# Patient Record
Sex: Male | Born: 1985 | Race: White | Hispanic: No | Marital: Single | Smoking: Current every day smoker
Health system: Southern US, Community
[De-identification: ages and names within clinical notes are randomized; demographics above are authoritative.]

## PROBLEM LIST (undated history)

## (undated) DIAGNOSIS — M549 Dorsalgia, unspecified: Secondary | ICD-10-CM

## (undated) HISTORY — PX: TONSILLECTOMY: SUR1361

---

## 2004-01-30 ENCOUNTER — Emergency Department (HOSPITAL_COMMUNITY): Admission: EM | Admit: 2004-01-30 | Discharge: 2004-01-30 | Payer: Self-pay | Admitting: Family Medicine

## 2005-04-01 ENCOUNTER — Emergency Department (HOSPITAL_COMMUNITY): Admission: EM | Admit: 2005-04-01 | Discharge: 2005-04-01 | Payer: Self-pay | Admitting: Family Medicine

## 2005-04-24 ENCOUNTER — Emergency Department (HOSPITAL_COMMUNITY): Admission: EM | Admit: 2005-04-24 | Discharge: 2005-04-24 | Payer: Self-pay | Admitting: Emergency Medicine

## 2005-04-30 ENCOUNTER — Emergency Department (HOSPITAL_COMMUNITY): Admission: EM | Admit: 2005-04-30 | Discharge: 2005-04-30 | Payer: Self-pay | Admitting: Family Medicine

## 2005-05-05 ENCOUNTER — Ambulatory Visit: Payer: Self-pay | Admitting: Internal Medicine

## 2005-05-08 ENCOUNTER — Ambulatory Visit (HOSPITAL_COMMUNITY): Admission: RE | Admit: 2005-05-08 | Discharge: 2005-05-08 | Payer: Self-pay | Admitting: Internal Medicine

## 2005-05-10 ENCOUNTER — Encounter: Admission: RE | Admit: 2005-05-10 | Discharge: 2005-08-08 | Payer: Self-pay | Admitting: Internal Medicine

## 2006-05-13 ENCOUNTER — Emergency Department (HOSPITAL_COMMUNITY): Admission: EM | Admit: 2006-05-13 | Discharge: 2006-05-13 | Payer: Self-pay | Admitting: Emergency Medicine

## 2007-09-20 IMAGING — CT CT ANGIO CHEST
4 of 5 series · 18 of 31 positions shown · IV contrast (100 ML OMNI 300)
Comparison: none

CLINICAL DATA: Severe chest and abdominal pain.  Shortness of breath.    Nausea.  Evaluate for pulmonary embolism and aortic aneurysm or dissection.
 CT ANGIOGRAPHY OF CHEST:
TECHNIQUE: Multidetector CT imaging of the chest was performed during bolus injection of intravenous contrast.  Multiplanar CT angiographic image reconstructions were generated to evaluate the vascular anatomy.
 Contrast:  100 cc Omnipaque 300.
TECHNIQUE: Multidetector CT imaging of the abdomen was performed during bolus injection of intravenous contrast.  Multiplanar CT angiographic image reconstructions were generated to evaluate the vascular anatomy.

[Series 2: pe · axial · 0.73mm/px · z∈[-530,-158]mm · 9 of 383 slices shown]
[im 43/383  lung]
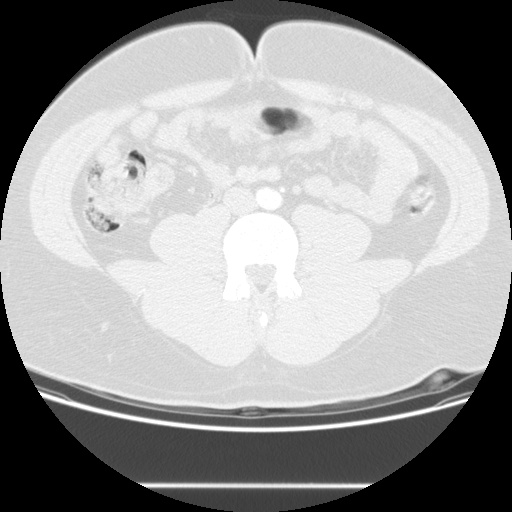
[im 85/383  mediastinal]
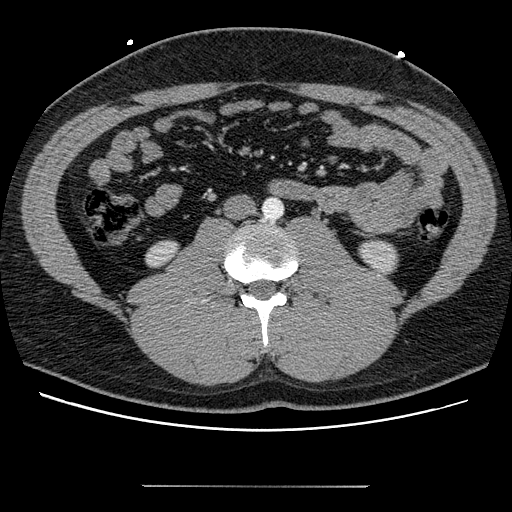
[im 128/383  lung]
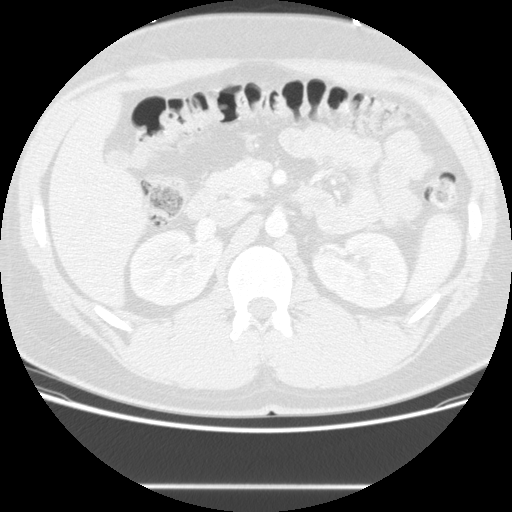
[im 170/383  mediastinal]
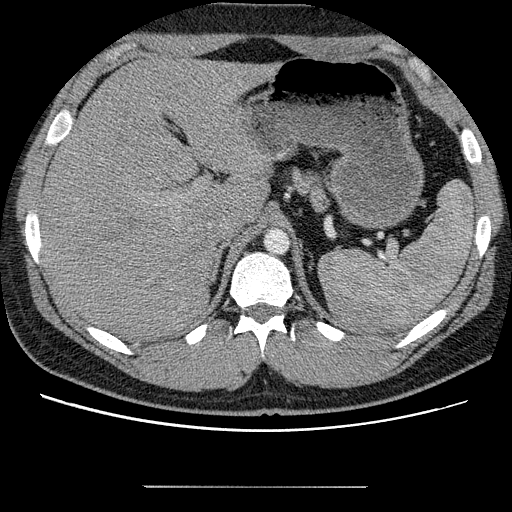
[im 188/383  lung]
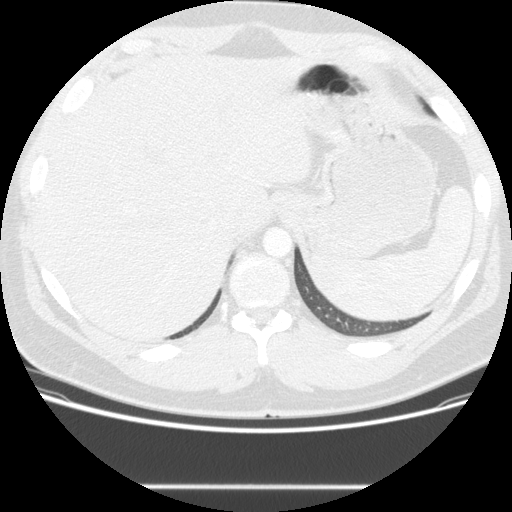
[im 213/383  mediastinal]
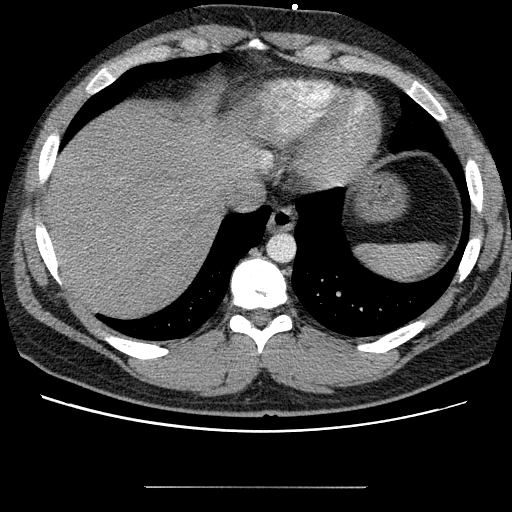
[im 255/383  lung]
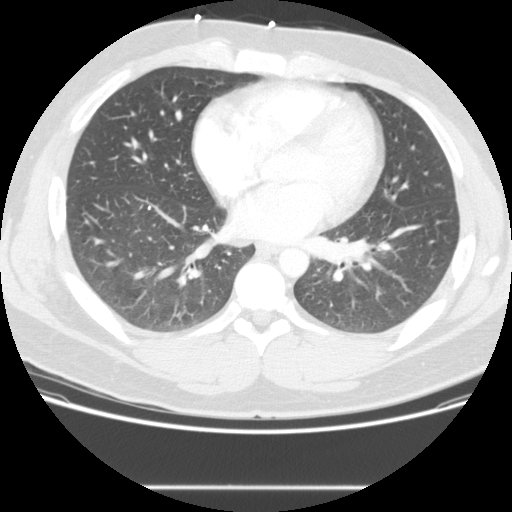
[im 298/383  mediastinal]
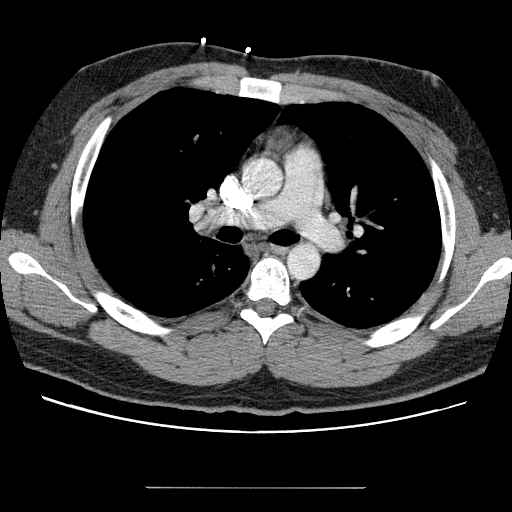
[im 340/383  lung]
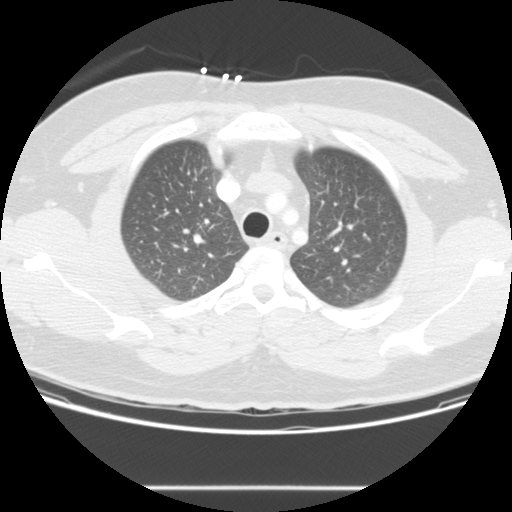

[Series 3: recon 2: pe · axial · 0.73mm/px · z∈[-464,-224]mm · 4 of 192 slices shown]
[im 48/192  lung]
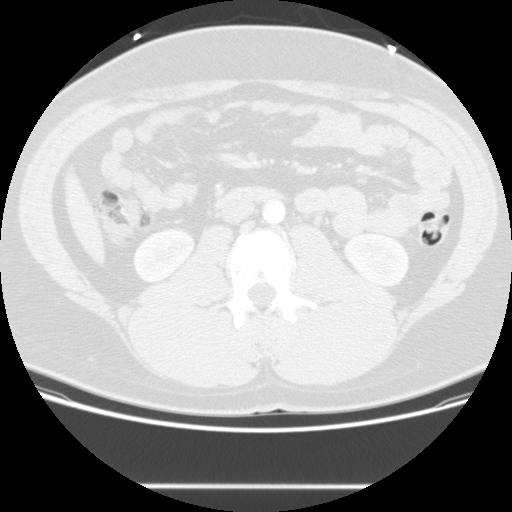
[im 95/192  lung]
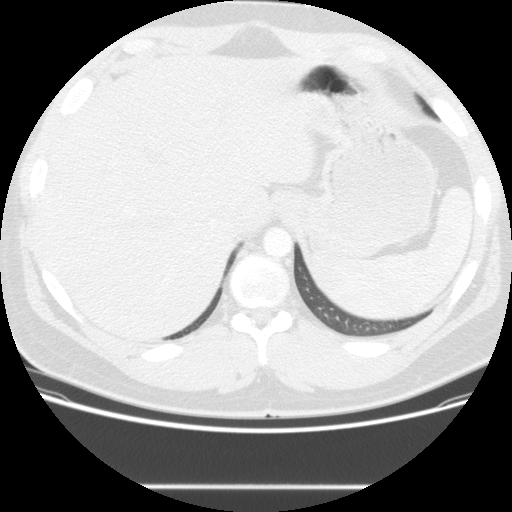
[im 96/192  lung]
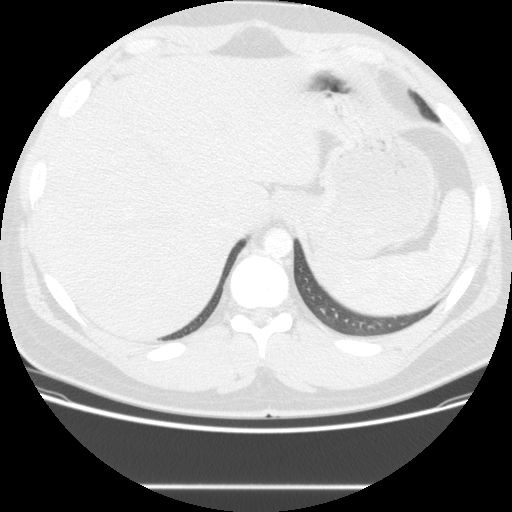
[im 144/192  lung]
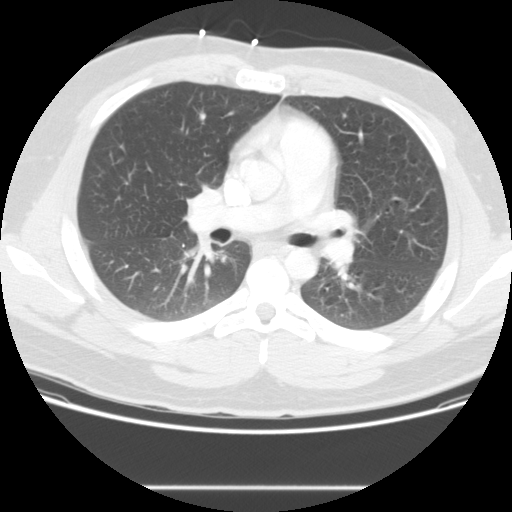

[Series 201: reformatted · sagittal · 0.96mm/px · 3 of 182 slices shown (1 of 2)]
[im 46/182  lung]
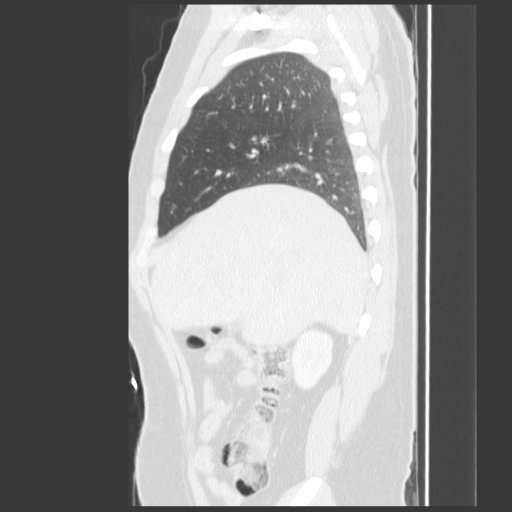
[im 91/182  lung]
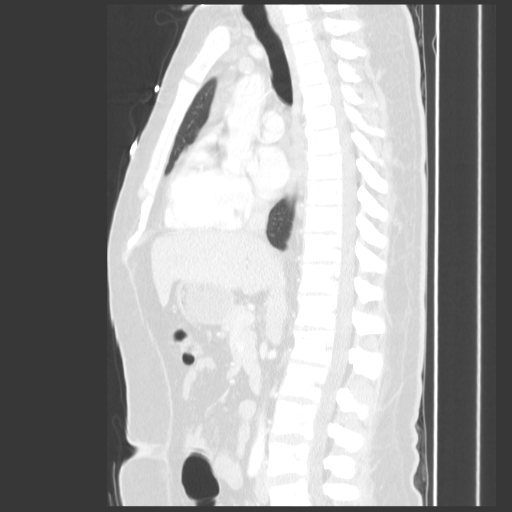
[im 136/182  lung]
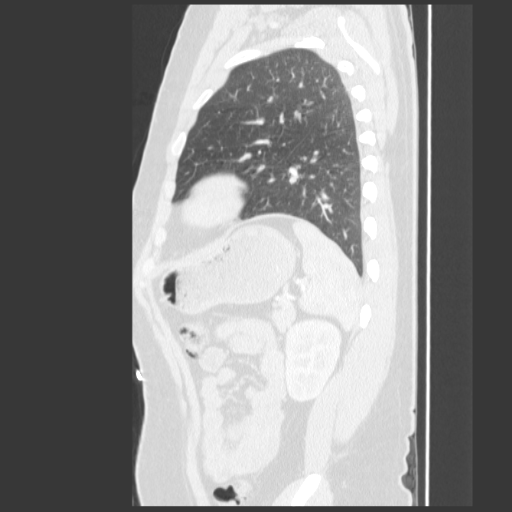

[Series 202: reformatted · coronal · 0.96mm/px · 2 of 142 slices shown (2 of 2)]
[im 48/142  lung]
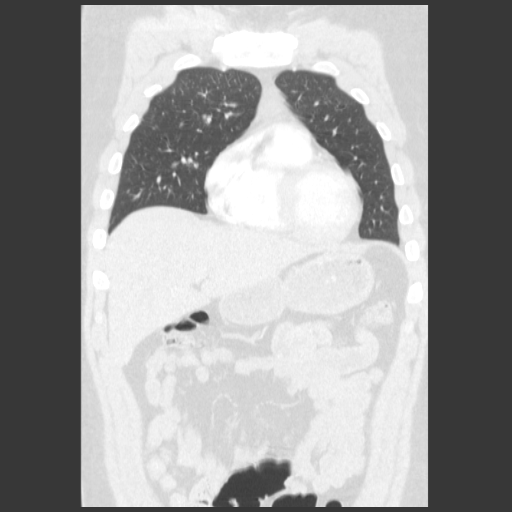
[im 95/142  lung]
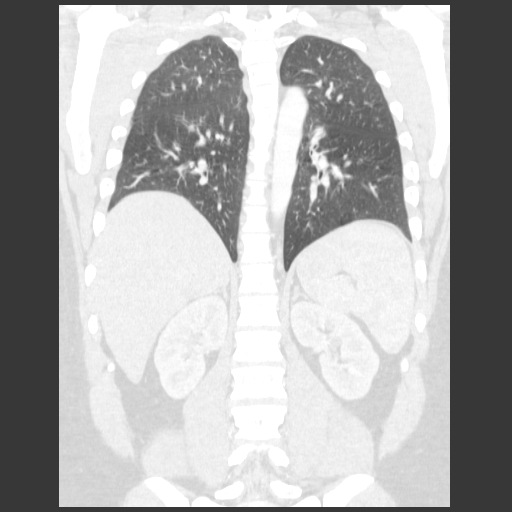

[18 of 31 positions shown; findings below may reference images not displayed]

FINDINGS: Contrast opacification of the pulmonary arteries is somewhat suboptimal, but no acute pulmonary embolism is identified.  There is no evidence of thoracic aortic aneurysm or dissection.  Mild residual thymic tissue is seen in the anterior mediastinum, which is normal for age.  There is no evidence of hilar or mediastinal masses.  No adenopathy is seen elsewhere within the thorax.
 There is no evidence of pleural or pericardial effusion.  There is no evidence of pulmonary infiltrate or mass.
IMPRESSION: Negative.  No evidence of acute pulmonary embolism or other active disease.  No evidence of aortic aneurysm or dissection. 
 CT ANGIOGRAPHY OF ABDOMEN:
FINDINGS: There is no evidence of abdominal aortic aneurysm or dissection.  There is no evidence of retroperitoneal hemorrhage.  The abdominal parenchymal organs are normal in appearance.  The gallbladder is unremarkable.  There is no evidence of mass or inflammatory process.  There is no evidence of dilated bowel loops.
IMPRESSION: Negative.  No evidence of aortic aneurysm or dissection or other acute findings.

## 2008-03-03 ENCOUNTER — Encounter: Payer: Self-pay | Admitting: Family Medicine

## 2012-01-12 ENCOUNTER — Encounter (HOSPITAL_BASED_OUTPATIENT_CLINIC_OR_DEPARTMENT_OTHER): Payer: Self-pay | Admitting: Family Medicine

## 2012-01-12 ENCOUNTER — Emergency Department (HOSPITAL_BASED_OUTPATIENT_CLINIC_OR_DEPARTMENT_OTHER)
Admission: EM | Admit: 2012-01-12 | Discharge: 2012-01-12 | Disposition: A | Discharge status: Home / Self Care | Payer: Self-pay | Attending: Emergency Medicine | Admitting: Emergency Medicine

## 2012-01-12 ENCOUNTER — Emergency Department (INDEPENDENT_AMBULATORY_CARE_PROVIDER_SITE_OTHER): Payer: Self-pay

## 2012-01-12 DIAGNOSIS — N451 Epididymitis: Secondary | ICD-10-CM

## 2012-01-12 DIAGNOSIS — R109 Unspecified abdominal pain: Secondary | ICD-10-CM

## 2012-01-12 DIAGNOSIS — R300 Dysuria: Secondary | ICD-10-CM | POA: Insufficient documentation

## 2012-01-12 DIAGNOSIS — K089 Disorder of teeth and supporting structures, unspecified: Secondary | ICD-10-CM | POA: Insufficient documentation

## 2012-01-12 DIAGNOSIS — N509 Disorder of male genital organs, unspecified: Secondary | ICD-10-CM | POA: Insufficient documentation

## 2012-01-12 DIAGNOSIS — N432 Other hydrocele: Secondary | ICD-10-CM

## 2012-01-12 DIAGNOSIS — I861 Scrotal varices: Secondary | ICD-10-CM

## 2012-01-12 DIAGNOSIS — F17200 Nicotine dependence, unspecified, uncomplicated: Secondary | ICD-10-CM | POA: Insufficient documentation

## 2012-01-12 LAB — URINE MICROSCOPIC-ADD ON

## 2012-01-12 LAB — URINALYSIS, ROUTINE W REFLEX MICROSCOPIC
Specific Gravity, Urine: 1.029 (ref 1.005–1.030)
Urobilinogen, UA: 1 mg/dL (ref 0.0–1.0)
pH: 5.5 (ref 5.0–8.0)

## 2012-01-12 MED ORDER — CIPROFLOXACIN HCL 500 MG PO TABS: 500.0000 mg | ORAL_TABLET | Freq: Two times a day (BID) | ORAL | Status: DC

## 2012-01-12 MED ORDER — CIPROFLOXACIN HCL 500 MG PO TABS: 500.0000 mg | ORAL_TABLET | Freq: Two times a day (BID) | ORAL | Status: AC

## 2012-01-12 MED ORDER — OXYCODONE-ACETAMINOPHEN 5-325 MG PO TABS
2.0000 | ORAL_TABLET | Freq: Once | ORAL | Status: AC
  Administered 2012-01-12: 2 via ORAL
  Filled 2012-01-12: qty 2

## 2012-01-12 MED ORDER — OXYCODONE-ACETAMINOPHEN 5-325 MG PO TABS: 2.0000 | ORAL_TABLET | ORAL | Status: AC | PRN

## 2012-01-12 MED ORDER — AZITHROMYCIN 250 MG PO TABS
2000.0000 mg | ORAL_TABLET | Freq: Once | ORAL | Status: AC
  Administered 2012-01-12: 2000 mg via ORAL
  Filled 2012-01-12: qty 8

## 2012-01-12 NOTE — ED Notes (Signed)
MD at bedside. 

## 2012-01-12 NOTE — ED Provider Notes (Signed)
History     CSN: [id number]  Arrival date & time 01/12/12  1336   First MD Initiated Contact with Patient 01/12/12 1410      Chief Complaint  Patient presents with  . Abdominal Pain  . Dysuria  . Dental Pain    (Consider location/radiation/quality/duration/timing/severity/associated sxs/prior treatment) Patient is a 26 y.o. male presenting with abdominal pain, dysuria, and tooth pain. The history is provided by the patient.  Abdominal Pain The primary symptoms of the illness include abdominal pain and dysuria. The current episode started more than 2 days ago. The onset of the illness was gradual. The problem has been gradually worsening.  Significant associated medical issues do not include inflammatory bowel disease or diabetes.  Dysuria   Dental PainThe primary symptoms include mouth pain. The symptoms began more than 1 week ago. The symptoms are worsening. The symptoms are new. The symptoms occur constantly.  Additional symptoms include: gum swelling.  Pt complains of a broken tooth and request a referral to a dentist  History reviewed. No pertinent past medical history.  History reviewed. No pertinent past surgical history.  History reviewed. No pertinent family history.  History  Substance Use Topics  . Smoking status: Current Everyday Smoker  . Smokeless tobacco: Not on file  . Alcohol Use: No      Review of Systems  HENT: Positive for dental problem.   Gastrointestinal: Positive for abdominal pain.  Genitourinary: Positive for dysuria.  All other systems reviewed and are negative.    Allergies  Ceclor; Toradol; and Ultram  Home Medications   Current Outpatient Rx  Name Route Sig Dispense Refill  . IBUPROFEN 200 MG PO TABS Oral Take 600 mg by mouth every 6 (six) hours as needed. Patient used this medication for pain.      BP 142/73  Pulse 67  Temp(Src) 98.8 F (37.1 C) (Oral)  Resp 16  Ht 6\' 2"  (1.88 m)  Wt 261 lb (118.389 kg)  BMI 33.51 kg/m2   SpO2 100%  Physical Exam  Nursing note and vitals reviewed. Constitutional: He appears well-developed and well-nourished.  HENT:  Head: Normocephalic and atraumatic.  Right Ear: External ear normal.  Left Ear: External ear normal.  Nose: Nose normal.       Broken lower right 1st molar  Eyes: Conjunctivae are normal. Pupils are equal, round, and reactive to light.  Neck: Normal range of motion. Neck supple.  Cardiovascular: Normal rate.   Pulmonary/Chest: Effort normal.  Abdominal: Soft.  Genitourinary: Penis normal.       Tender bilat scrotum, testicle tender,  No swelling  Musculoskeletal: Normal range of motion.  Neurological: He is alert.  Skin: Skin is warm.    ED Course  Procedures (including critical care time)  Labs Reviewed  URINALYSIS, ROUTINE W REFLEX MICROSCOPIC - Abnormal; Notable for the following:    Color, Urine AMBER (*) BIOCHEMICALS MAY BE AFFECTED BY COLOR   Bilirubin Urine SMALL (*)    Ketones, ur 15 (*)    Leukocytes, UA TRACE (*)    All other components within normal limits  URINE MICROSCOPIC-ADD ON - Abnormal; Notable for the following:    Bacteria, UA MANY (*)    All other components within normal limits   US Scrotum  01/12/2012  *RADIOLOGY REPORT*  Clinical Data: Pain  ULTRASOUND OF SCROTUM  Technique:  Complete ultrasound examination of the testicles, epididymis, and other scrotal structures was performed.  Comparison:  None.  Findings:  Both testicles are normal in  size and echotexture.  The right testicle measures 4.1 x 2.2 x 3.1 cm, and the left testicle measures 4.6 x 2.3 x 3.4 cm.  Both epididymi have a normal appearance.  Vascular flow appears symmetric within both testicles. Small, simple bilateral hydroceles are present.  There is a small varicocele on the left.  IMPRESSION: Small, simple bilateral hydroceles.  Small left varicocele.  Original Report Authenticated By: Brandon Melnick, M.D.     No diagnosis found.    MDM  Pt given rx for  cipro 500 bid.  Referral to urology        Elson Areas, PA 01/12/12 [phone]

## 2012-01-12 NOTE — ED Notes (Signed)
Patient transported to Ultrasound 

## 2012-01-12 NOTE — ED Notes (Signed)
Lower abdominal pain dysuria for several days also reports pain in testicles as well as tooth pain;

## 2012-01-12 NOTE — Discharge Instructions (Signed)
Epididymitis  Epididymitis is a swelling (inflammation) of the epididymis. The epididymis is a cord-like structure along the back part of the testicle. Epididymitis is usually, but not always, caused by infection. This is usually a sudden problem beginning with chills, fever and pain behind the scrotum and in the testicle. There may be swelling and redness of the testicle.  DIAGNOSIS   Physical examination will reveal a tender, swollen epididymis. Sometimes, cultures are obtained from the urine or from prostate secretions to help find out if there is an infection or if the cause is a different problem. Sometimes, blood work is performed to see if your white blood cell count is elevated and if a germ (bacterial) or viral infection is present. Using this knowledge, an appropriate medicine which kills germs (antibiotic) can be chosen by your caregiver. A viral infection causing epididymitis will most often go away (resolve) without treatment.  HOME CARE INSTRUCTIONS    Hot sitz baths for 20 minutes, 4 times per day, may help relieve pain.   Only take over-the-counter or prescription medicines for pain, discomfort or fever as directed by your caregiver.   Take all medicines, including antibiotics, as directed. Take the antibiotics for the full prescribed length of time even if you are feeling better.   It is very important to keep all follow-up appointments.  SEEK IMMEDIATE MEDICAL CARE IF:    You have a fever.   You have pain not relieved with medicines.   You have any worsening of your problems.   Your pain seems to come and go.   You develop pain, redness, and swelling in the scrotum and surrounding areas.  MAKE SURE YOU:    Understand these instructions.   Will watch your condition.   Will get help right away if you are not doing well or get worse.  Document Released: 08/25/2000 Document Revised: 08/17/2011 Document Reviewed: 07/15/2009  Exit[employer] Patient Information 2012 [employer].

## 2012-01-13 NOTE — ED Provider Notes (Signed)
Medical screening examination/treatment/procedure(s) were performed by non-physician practitioner and as supervising physician I was immediately available for consultation/collaboration.   Omair Dettmer Y. Atzin Buchta, MD 01/13/12 0050 

## 2013-03-26 ENCOUNTER — Emergency Department (HOSPITAL_BASED_OUTPATIENT_CLINIC_OR_DEPARTMENT_OTHER): Payer: No Typology Code available for payment source

## 2013-03-26 ENCOUNTER — Encounter (HOSPITAL_BASED_OUTPATIENT_CLINIC_OR_DEPARTMENT_OTHER): Payer: Self-pay

## 2013-03-26 ENCOUNTER — Emergency Department (HOSPITAL_BASED_OUTPATIENT_CLINIC_OR_DEPARTMENT_OTHER)
Admission: EM | Admit: 2013-03-26 | Discharge: 2013-03-27 | Disposition: A | Discharge status: Home / Self Care | Payer: No Typology Code available for payment source | Attending: Emergency Medicine | Admitting: Emergency Medicine

## 2013-03-26 DIAGNOSIS — F17200 Nicotine dependence, unspecified, uncomplicated: Secondary | ICD-10-CM | POA: Insufficient documentation

## 2013-03-26 DIAGNOSIS — S139XXA Sprain of joints and ligaments of unspecified parts of neck, initial encounter: Secondary | ICD-10-CM | POA: Insufficient documentation

## 2013-03-26 DIAGNOSIS — Y92410 Unspecified street and highway as the place of occurrence of the external cause: Secondary | ICD-10-CM | POA: Insufficient documentation

## 2013-03-26 DIAGNOSIS — Y9389 Activity, other specified: Secondary | ICD-10-CM | POA: Insufficient documentation

## 2013-03-26 DIAGNOSIS — S161XXA Strain of muscle, fascia and tendon at neck level, initial encounter: Secondary | ICD-10-CM
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
## 2013-03-26 NOTE — ED Notes (Signed)
Pt ambulatory without assistance to bathroom.  ?

## 2013-03-26 NOTE — ED Notes (Signed)
MD at bedside. 
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
## 2013-03-27 MED ORDER — HYDROCODONE-ACETAMINOPHEN 5-325 MG PO TABS: 1.0000 | ORAL_TABLET | Freq: Four times a day (QID) | ORAL | Status: DC | PRN

## 2013-05-21 ENCOUNTER — Emergency Department (HOSPITAL_BASED_OUTPATIENT_CLINIC_OR_DEPARTMENT_OTHER)
Admission: EM | Admit: 2013-05-21 | Discharge: 2013-05-21 | Disposition: A | Discharge status: Home / Self Care | Payer: No Typology Code available for payment source | Attending: Emergency Medicine | Admitting: Emergency Medicine

## 2013-05-21 ENCOUNTER — Encounter (HOSPITAL_BASED_OUTPATIENT_CLINIC_OR_DEPARTMENT_OTHER): Payer: Self-pay | Admitting: Emergency Medicine

## 2013-05-21 DIAGNOSIS — T23102A Burn of first degree of left hand, unspecified site, initial encounter: Secondary | ICD-10-CM

## 2013-05-21 DIAGNOSIS — Y929 Unspecified place or not applicable: Secondary | ICD-10-CM | POA: Insufficient documentation

## 2013-05-21 DIAGNOSIS — F17200 Nicotine dependence, unspecified, uncomplicated: Secondary | ICD-10-CM | POA: Insufficient documentation

## 2013-05-21 DIAGNOSIS — X12XXXA Contact with other hot fluids, initial encounter: Secondary | ICD-10-CM | POA: Insufficient documentation

## 2013-05-21 DIAGNOSIS — T23109A Burn of first degree of unspecified hand, unspecified site, initial encounter: Secondary | ICD-10-CM | POA: Insufficient documentation

## 2013-05-21 DIAGNOSIS — Y9389 Activity, other specified: Secondary | ICD-10-CM | POA: Insufficient documentation

## 2013-05-21 DIAGNOSIS — Z23 Encounter for immunization: Secondary | ICD-10-CM | POA: Insufficient documentation

## 2013-05-21 IMAGING — US US SCROTUM
1 series · 14 of 25 positions shown · non-contrast
Comparison: None.

CLINICAL DATA: Pain

ULTRASOUND OF SCROTUM
TECHNIQUE: Complete ultrasound examination of the testicles,
epididymis, and other scrotal structures was performed.

[Series 1: us scrotum · 0.08mm/px · 14 of 46 slices shown]
[im 1/46]
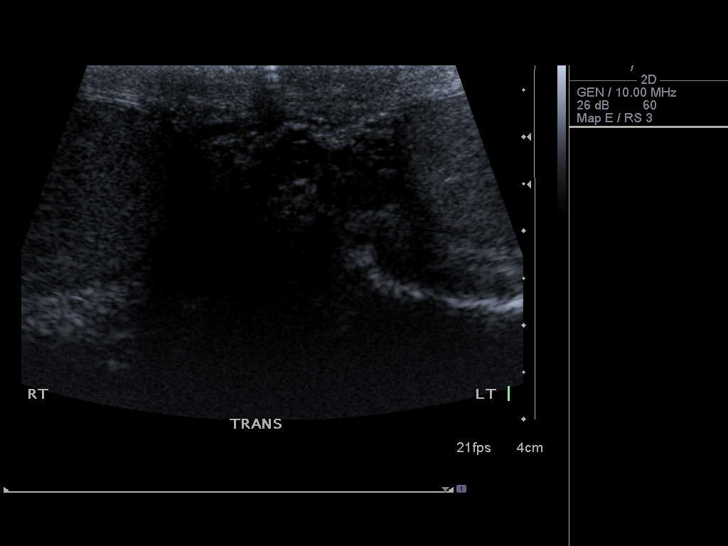
[im 4/46]
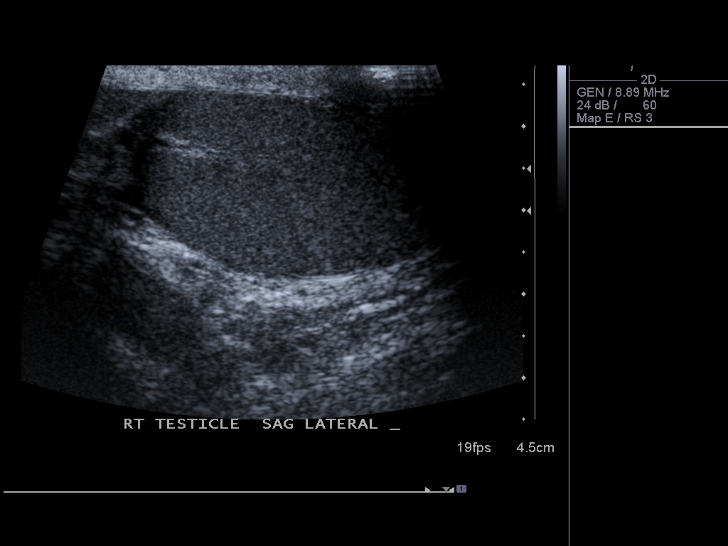
[im 8/46]
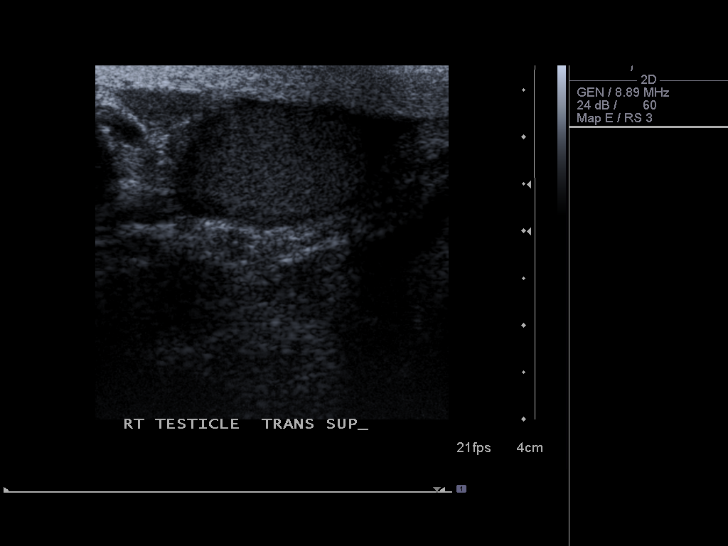
[im 12/46]
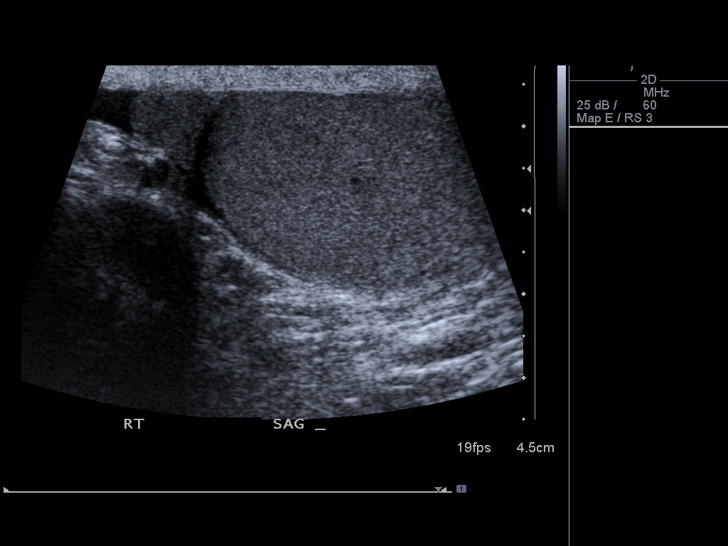
[im 16/46]
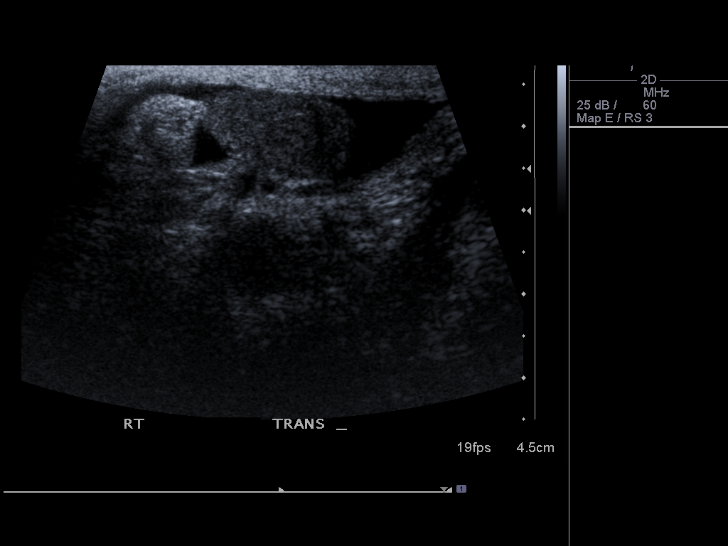
[im 17/46]
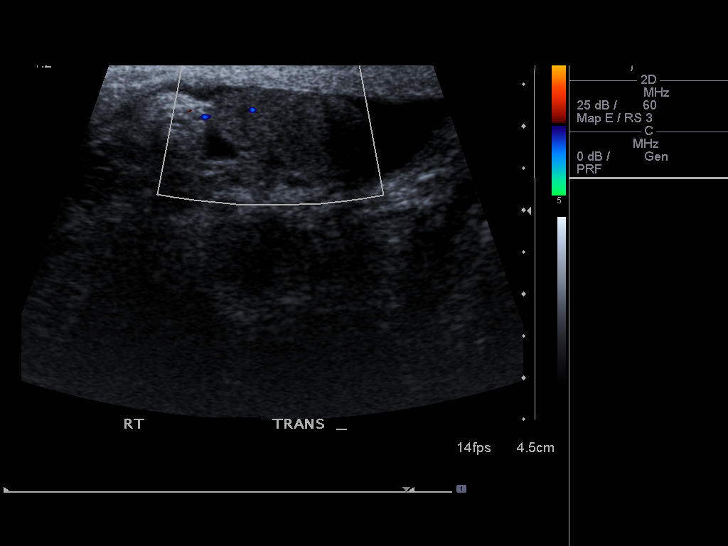
[im 21/46]
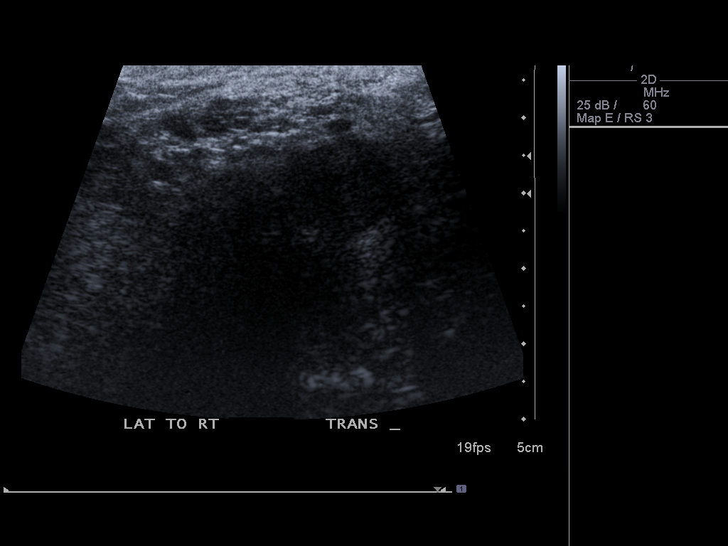
[im 25/46]
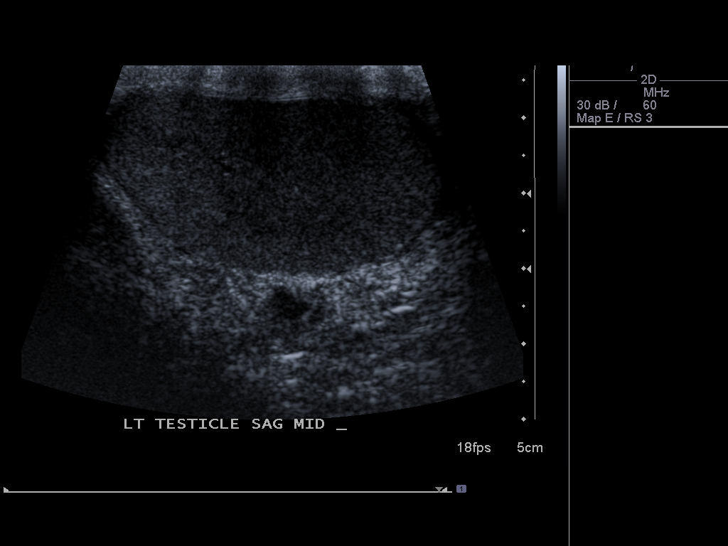
[im 29/46]
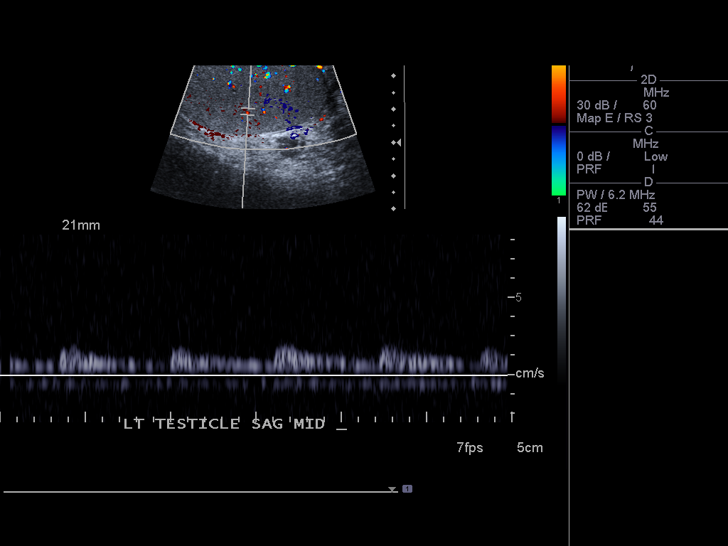
[im 31/46]
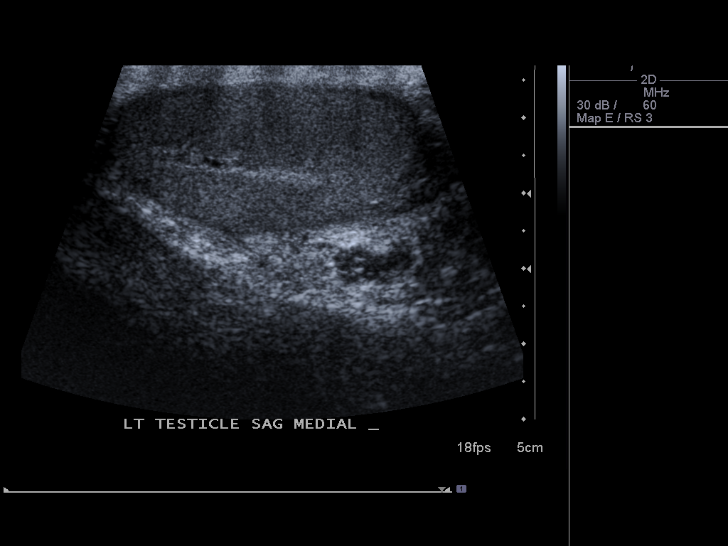
[im 34/46]
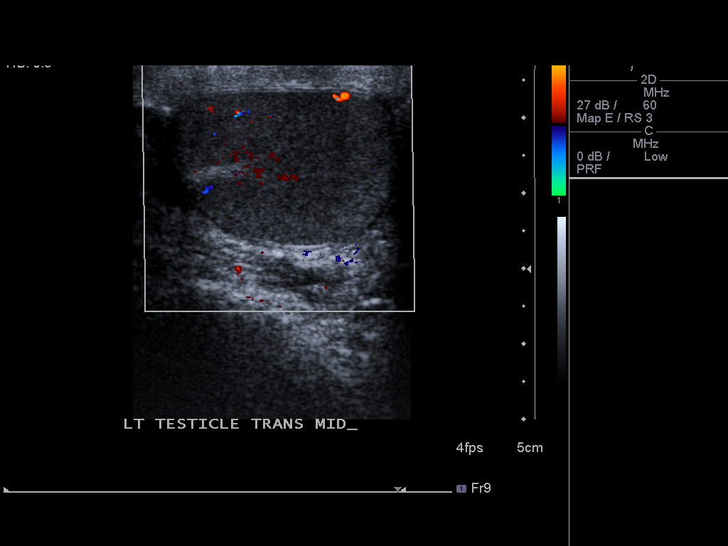
[im 38/46]
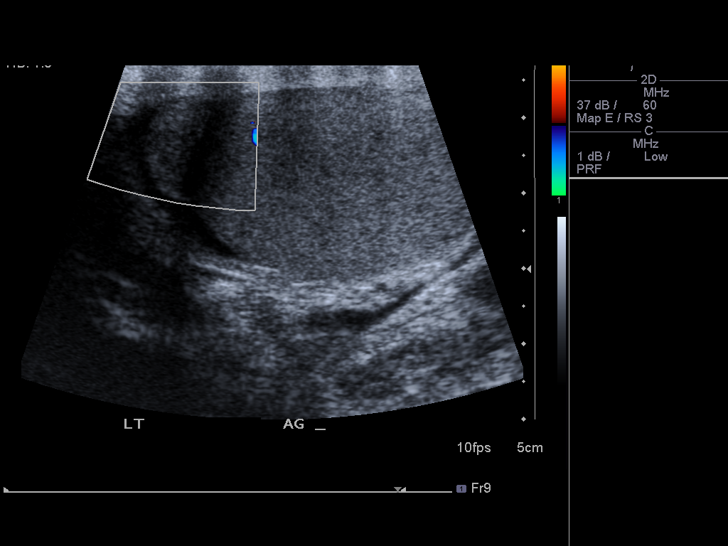
[im 42/46]
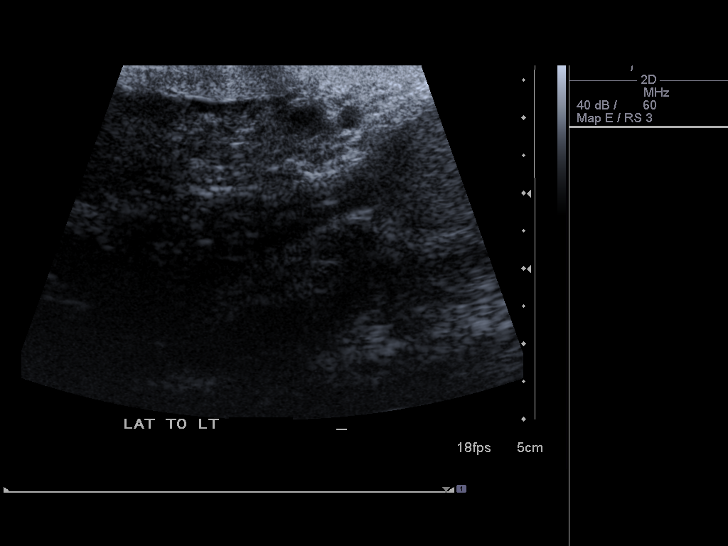
[im 46/46]
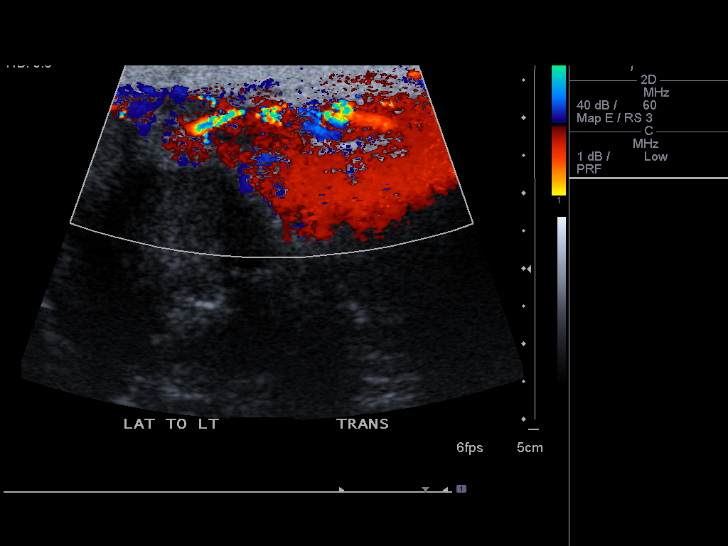

[14 of 25 positions shown; findings below may reference images not displayed]

FINDINGS: Both testicles are normal in size and echotexture.  The right
testicle measures 4.1 x 2.2 x 3.1 cm, and the left testicle
measures 4.6 x 2.3 x 3.4 cm.  Both epididymi have a normal
appearance.  Vascular flow appears symmetric within both testicles.
Small, simple bilateral hydroceles are present.  There is a small
varicocele on the left.
IMPRESSION: Small, simple bilateral hydroceles.

Small left varicocele.

## 2013-05-21 MED ORDER — SILVER SULFADIAZINE 1 % EX CREA
TOPICAL_CREAM | Freq: Once | CUTANEOUS | Status: AC
  Administered 2013-05-21: 21:00:00 via TOPICAL
  Filled 2013-05-21: qty 85

## 2013-05-21 MED ORDER — HYDROCODONE-ACETAMINOPHEN 5-325 MG PO TABS
1.0000 | ORAL_TABLET | Freq: Once | ORAL | Status: AC
  Administered 2013-05-21: 1 via ORAL
  Filled 2013-05-21: qty 1

## 2013-05-21 MED ORDER — TETANUS-DIPHTH-ACELL PERTUSSIS 5-2.5-18.5 LF-MCG/0.5 IM SUSP
0.5000 mL | Freq: Once | INTRAMUSCULAR | Status: AC
  Administered 2013-05-21: 0.5 mL via INTRAMUSCULAR
  Filled 2013-05-21: qty 0.5
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
## 2013-11-10 ENCOUNTER — Encounter (HOSPITAL_BASED_OUTPATIENT_CLINIC_OR_DEPARTMENT_OTHER): Payer: Self-pay | Admitting: Emergency Medicine

## 2013-11-10 ENCOUNTER — Emergency Department (HOSPITAL_BASED_OUTPATIENT_CLINIC_OR_DEPARTMENT_OTHER)
Admission: EM | Admit: 2013-11-10 | Discharge: 2013-11-10 | Disposition: A | Discharge status: Home / Self Care | Payer: No Typology Code available for payment source | Attending: Emergency Medicine | Admitting: Emergency Medicine

## 2013-11-10 DIAGNOSIS — K089 Disorder of teeth and supporting structures, unspecified: Secondary | ICD-10-CM | POA: Insufficient documentation

## 2013-11-10 DIAGNOSIS — F17200 Nicotine dependence, unspecified, uncomplicated: Secondary | ICD-10-CM | POA: Insufficient documentation

## 2013-11-10 DIAGNOSIS — K0889 Other specified disorders of teeth and supporting structures: Secondary | ICD-10-CM

## 2013-11-10 DIAGNOSIS — K0381 Cracked tooth: Secondary | ICD-10-CM | POA: Insufficient documentation

## 2013-11-10 MED ORDER — CLINDAMYCIN HCL 150 MG PO CAPS: 150.0000 mg | ORAL_CAPSULE | Freq: Three times a day (TID) | ORAL | Status: DC

## 2013-11-10 MED ORDER — HYDROCODONE-ACETAMINOPHEN 5-325 MG PO TABS: 1.0000 | ORAL_TABLET | ORAL | Status: DC | PRN

## 2013-11-10 NOTE — ED Notes (Signed)
Bottom right side tooth decay causing pain. No dentist. Had to leave work because of pain.

## 2013-11-10 NOTE — Discharge Instructions (Signed)
Dental Pain °Toothache is pain in or around a tooth. It may get worse with chewing or with cold or heat.  °HOME CARE °· Your dentist may use a numbing medicine during treatment. If so, you may need to avoid eating until the medicine wears off. Ask your dentist about this. °· Only take medicine as told by your dentist or doctor. °· Avoid chewing food near the painful tooth until after all treatment is done. Ask your dentist about this. °GET HELP RIGHT AWAY IF:  °· The problem gets worse or new problems appear. °· You have a fever. °· There is redness and puffiness (swelling) of the face, jaw, or neck. °· You cannot open your mouth. °· There is pain in the jaw. °· There is very bad pain that is not helped by medicine. °MAKE SURE YOU:  °· Understand these instructions. °· Will watch your condition. °· Will get help right away if you are not doing well or get worse. °Document Released: 02/14/2008 Document Revised: 11/20/2011 Document Reviewed: 02/14/2008 °[employer]® Patient Information ©2014 [employer]. ° °

## 2013-11-10 NOTE — ED Provider Notes (Signed)
Medical screening examination/treatment/procedure(s) were performed by non-physician practitioner and as supervising physician I was immediately available for consultation/collaboration.   EKG Interpretation None        Yuri Flener, MD 11/10/13 1506 

## 2013-11-10 NOTE — ED Provider Notes (Signed)
CSN: [id number]     Arrival date & time 11/10/13  [id number] History   First MD Initiated Contact with Patient 11/10/13 [phone]     Chief Complaint  Patient presents with  . Dental Pain     (Consider location/radiation/quality/duration/timing/severity/associated sxs/prior Treatment) HPI Comments: Pt states that he started with left lower dentition pain this morning. Pt state that he has a history of problem with a cracked tooth but hasn't been able to afford the dentist. Denies fever, swelling or problem breathing or swallowing.  The history is provided by the patient. No language interpreter was used.    History reviewed. No pertinent past medical history. History reviewed. No pertinent past surgical history. No family history on file. History  Substance Use Topics  . Smoking status: Current Every Day Smoker -- 1.00 packs/day    Types: Cigarettes  . Smokeless tobacco: Not on file  . Alcohol Use: No    Review of Systems  Respiratory: Negative.   Cardiovascular: Negative.       Allergies  Ceclor; Toradol; and Ultram  Home Medications   Current Outpatient Rx  Name  Route  Sig  Dispense  Refill  . ibuprofen (ADVIL,MOTRIN) 200 MG tablet   Oral   Take 600 mg by mouth every 6 (six) hours as needed. Patient used this medication for pain.         . clindamycin (CLEOCIN) 150 MG capsule   Oral   Take 1 capsule (150 mg total) by mouth 3 (three) times daily.   21 capsule   0   . HYDROcodone-acetaminophen (NORCO/VICODIN) 5-325 MG per tablet   Oral   Take 1-2 tablets by mouth every 6 (six) hours as needed for pain.   20 tablet   0   . HYDROcodone-acetaminophen (NORCO/VICODIN) 5-325 MG per tablet   Oral   Take 1-2 tablets by mouth every 4 (four) hours as needed.   10 tablet   0    BP 131/67  Pulse 70  Temp(Src) 98.5 F (36.9 C) (Oral)  Resp 18  Wt 255 lb (115.667 kg)  SpO2 100% Physical Exam  Nursing note and vitals reviewed. Constitutional: He appears well-developed  and well-nourished.  HENT:  Right Ear: External ear normal.  Left Ear: External ear normal.  Mouth/Throat:    Eyes: Conjunctivae and EOM are normal.  Cardiovascular: Normal rate and regular rhythm.   Pulmonary/Chest: Effort normal and breath sounds normal.    ED Course  Procedures (including critical care time) Labs Review Labs Reviewed - No data to display Imaging Review No results found.   EKG Interpretation None      MDM   Final diagnoses:  Toothache    Will treat with vicodin and clinda and refer to dentist   Teressa LowerVrinda Argyle Gustafson, NP 11/10/13 1005

## 2013-12-21 ENCOUNTER — Emergency Department (HOSPITAL_BASED_OUTPATIENT_CLINIC_OR_DEPARTMENT_OTHER)
Admission: EM | Admit: 2013-12-21 | Discharge: 2013-12-21 | Disposition: A | Discharge status: Home / Self Care | Payer: No Typology Code available for payment source | Attending: Emergency Medicine | Admitting: Emergency Medicine

## 2013-12-21 ENCOUNTER — Encounter (HOSPITAL_BASED_OUTPATIENT_CLINIC_OR_DEPARTMENT_OTHER): Payer: Self-pay | Admitting: Emergency Medicine

## 2013-12-21 DIAGNOSIS — K029 Dental caries, unspecified: Secondary | ICD-10-CM | POA: Insufficient documentation

## 2013-12-21 DIAGNOSIS — K0381 Cracked tooth: Secondary | ICD-10-CM | POA: Insufficient documentation

## 2013-12-21 DIAGNOSIS — K089 Disorder of teeth and supporting structures, unspecified: Secondary | ICD-10-CM | POA: Insufficient documentation

## 2013-12-21 DIAGNOSIS — F17200 Nicotine dependence, unspecified, uncomplicated: Secondary | ICD-10-CM | POA: Insufficient documentation

## 2013-12-21 DIAGNOSIS — K0889 Other specified disorders of teeth and supporting structures: Secondary | ICD-10-CM

## 2013-12-21 MED ORDER — HYDROCODONE-ACETAMINOPHEN 5-325 MG PO TABS: 1.0000 | ORAL_TABLET | Freq: Four times a day (QID) | ORAL | Status: AC | PRN

## 2013-12-21 MED ORDER — CLINDAMYCIN HCL 150 MG PO CAPS: 450.0000 mg | ORAL_CAPSULE | Freq: Three times a day (TID) | ORAL | Status: AC

## 2013-12-21 MED ORDER — HYDROCODONE-ACETAMINOPHEN 5-325 MG PO TABS
2.0000 | ORAL_TABLET | Freq: Once | ORAL | Status: AC
  Administered 2013-12-21: 2 via ORAL
  Filled 2013-12-21: qty 2

## 2013-12-21 NOTE — ED Provider Notes (Signed)
CSN: [id number]     Arrival date & time 12/21/13  4098 History   First MD Initiated Contact with Patient 12/21/13 1030     Chief Complaint  Patient presents with  . Dental Pain     (Consider location/radiation/quality/duration/timing/severity/associated sxs/prior Treatment) Patient is a 28 y.o. male presenting with tooth pain. The history is provided by the patient.  Dental Pain Location:  Lower and upper Upper teeth location:  16/LU 3rd molar Lower teeth location:  32/RL 3rd molar Quality:  Dull and sharp Severity:  Severe Onset quality:  Gradual Duration:  2 days Timing:  Constant Progression:  Worsening Chronicity:  Recurrent Context: dental caries and dental fracture   Context: not abscess and not crown fracture   Previous work-up:  Dental exam Relieved by:  Nothing Ineffective treatments:  Acetaminophen and NSAIDs Associated symptoms: no fever     History reviewed. No pertinent past medical history. History reviewed. No pertinent past surgical history. No family history on file. History  Substance Use Topics  . Smoking status: Current Every Day Smoker -- 1.00 packs/day    Types: Cigarettes  . Smokeless tobacco: Not on file  . Alcohol Use: No    Review of Systems  Constitutional: Negative for fever and chills.  Respiratory: Negative for cough and shortness of breath.   All other systems reviewed and are negative.     Allergies  Ceclor; Toradol; and Ultram  Home Medications   Current Outpatient Rx  Name  Route  Sig  Dispense  Refill  . clindamycin (CLEOCIN) 150 MG capsule   Oral   Take 3 capsules (450 mg total) by mouth 3 (three) times daily.   63 capsule   0   . HYDROcodone-acetaminophen (NORCO/VICODIN) 5-325 MG per tablet   Oral   Take 1 tablet by mouth every 6 (six) hours as needed for moderate pain.   10 tablet   0    BP 154/69  Pulse 75  Temp(Src) 97.8 F (36.6 C) (Oral)  Resp 24  Ht 6\' 1"  (1.854 m)  Wt 250 lb (113.399 kg)  BMI 32.99  kg/m2  SpO2 100% Physical Exam  Constitutional: He is oriented to person, place, and time. He appears well-developed and well-nourished. No distress.  HENT:  Head: Normocephalic and atraumatic.  Mouth/Throat: No oropharyngeal exudate.    Eyes: EOM are normal. Pupils are equal, round, and reactive to light.  Neck: Normal range of motion. Neck supple. No thyromegaly present.  Cardiovascular: Normal rate and regular rhythm.  Exam reveals no friction rub.   No murmur heard. Pulmonary/Chest: Effort normal and breath sounds normal. No respiratory distress. He has no wheezes. He has no rales.  Abdominal: He exhibits no distension. There is no tenderness. There is no rebound.  Musculoskeletal: Normal range of motion. He exhibits no edema.  Lymphadenopathy:    He has no cervical adenopathy.  Neurological: He is alert and oriented to person, place, and time.  Skin: He is not diaphoretic.    ED Course  Procedures (including critical care time) Labs Review Labs Reviewed - No data to display Imaging Review No results found.   EKG Interpretation None      MDM   Final diagnoses:  Pain, dental    66M here with dental pain, severe for past 2 days. No fevers, no difficulty swallowing, breathing. Hx of dental issues, but no insurance and no dentist at this time. Here, has large broken tooth in posterior R mandible and upper L maxilla. No abscess  identified. No lymphadenopathy. Will give vicodin, clindamycin, dental follow-up. Refused a block here.    Dagmar HaitWilliam Britnee Mcdevitt, MD 12/21/13 1121

## 2013-12-21 NOTE — ED Notes (Signed)
Patient here with severe dental pain since am, reports has broken teeth to right lower, and left upper for a while but pain just started

## 2014-03-01 ENCOUNTER — Encounter (HOSPITAL_BASED_OUTPATIENT_CLINIC_OR_DEPARTMENT_OTHER): Payer: Self-pay | Admitting: Emergency Medicine

## 2014-03-01 ENCOUNTER — Emergency Department (HOSPITAL_BASED_OUTPATIENT_CLINIC_OR_DEPARTMENT_OTHER)
Admission: EM | Admit: 2014-03-01 | Discharge: 2014-03-01 | Disposition: A | Discharge status: Home / Self Care | Payer: No Typology Code available for payment source | Attending: Emergency Medicine | Admitting: Emergency Medicine

## 2014-03-01 DIAGNOSIS — K029 Dental caries, unspecified: Secondary | ICD-10-CM

## 2014-03-01 DIAGNOSIS — F17200 Nicotine dependence, unspecified, uncomplicated: Secondary | ICD-10-CM | POA: Insufficient documentation

## 2014-03-01 MED ORDER — HYDROCODONE-ACETAMINOPHEN 5-325 MG PO TABS
2.0000 | ORAL_TABLET | Freq: Once | ORAL | Status: AC
Start: 2014-03-01 — End: 2014-03-01
  Administered 2014-03-01: 2 via ORAL
  Filled 2014-03-01: qty 2

## 2014-03-01 MED ORDER — HYDROCODONE-ACETAMINOPHEN 5-325 MG PO TABS: 1.0000 | ORAL_TABLET | ORAL | Status: AC | PRN

## 2014-03-01 MED ORDER — CLINDAMYCIN HCL 300 MG PO CAPS: 300.0000 mg | ORAL_CAPSULE | Freq: Four times a day (QID) | ORAL | Status: AC

## 2014-03-01 NOTE — ED Notes (Signed)
Family at bedside. 

## 2014-03-01 NOTE — Discharge Instructions (Signed)
Clindamycin as prescribed. Hydrocodone as prescribed as needed for pain.  Followup with dentistry. The number for Dr. Lucky Cowboy has been provided on this discharge summary. You've also been given the resource guide which may help you find an affordable dentist.   Dental Caries  Dental caries (also called tooth decay) is the most common oral disease. It can occur at any age, but is more common in children and young adults.  HOW DENTAL CARIES DEVELOPS  The process of decay begins when bacteria and foods (particularly sugars and starches) combine in your mouth to produce plaque. Plaque is a substance that sticks to the hard, outer surface of a tooth (enamel). The bacteria in plaque produce acids that attack enamel. These acids may also attack the root surface of a tooth (cementum) if it is exposed. Repeated attacks dissolve these surfaces and create holes in the tooth (cavities). If left untreated, the acids destroy the other layers of the tooth.  RISK FACTORS  Frequent sipping of sugary beverages.   Frequent snacking on sugary and starchy foods, especially those that easily get stuck in the teeth.   Poor oral hygiene.   Dry mouth.   Substance abuse such as methamphetamine abuse.   Broken or poor-fitting dental restorations.   Eating disorders.   Gastroesophageal reflux disease (GERD).   Certain radiation treatments to the head and neck. SYMPTOMS In the early stages of dental caries, symptoms are seldom present. Sometimes white, chalky areas may be seen on the enamel or other tooth layers. In later stages, symptoms may include:  Pits and holes on the enamel.  Toothache after sweet, hot, or cold foods or drinks are consumed.  Pain around the tooth.  Swelling around the tooth. DIAGNOSIS  Most of the time, dental caries is detected during a regular dental checkup. A diagnosis is made after a thorough medical and dental history is taken and the surfaces of your teeth are checked  for signs of dental caries. Sometimes special instruments, such as lasers, are used to check for dental caries. Dental X-ray exams may be taken so that areas not visible to the eye (such as between the contact areas of the teeth) can be checked for cavities.  TREATMENT  If dental caries is in its early stages, it may be reversed with a fluoride treatment or an application of a remineralizing agent at the dental office. Thorough brushing and flossing at home is needed to aid these treatments. If it is in its later stages, treatment depends on the location and extent of tooth destruction:   If a small area of the tooth has been destroyed, the destroyed area will be removed and cavities will be filled with a material such as gold, silver amalgam, or composite resin.   If a large area of the tooth has been destroyed, the destroyed area will be removed and a cap (crown) will be fitted over the remaining tooth structure.   If the center part of the tooth (pulp) is affected, a procedure called a root canal will be needed before a filling or crown can be placed.   If most of the tooth has been destroyed, the tooth may need to be pulled (extracted). HOME CARE INSTRUCTIONS You can prevent, stop, or reverse dental caries at home by practicing good oral hygiene. Good oral hygiene includes:  Thoroughly cleaning your teeth at least twice a day with a toothbrush and dental floss.   Using a fluoride toothpaste. A fluoride mouth rinse may also be used  if recommended by your dentist or health care provider.   Restricting the amount of sugary and starchy foods and sugary liquids you consume.   Avoiding frequent snacking on these foods and sipping of these liquids.   Keeping regular visits with a dentist for checkups and cleanings. PREVENTION   Practice good oral hygiene.  Consider a dental sealant. A dental sealant is a coating material that is applied by your dentist to the pits and grooves of teeth.  The sealant prevents food from being trapped in them. It may protect the teeth for several years.  Ask about fluoride supplements if you live in a community without fluorinated water or with water that has a low fluoride content. Use fluoride supplements as directed by your dentist or health care provider.  Allow fluoride varnish applications to teeth if directed by your dentist or health care provider. Document Released: 05/20/2002 Document Revised: 04/30/2013 Document Reviewed: 08/30/2012 [facility] Patient Information 2015 [place], [place]. This information is not intended to replace advice given to you by your health care provider. Make sure you discuss any questions you have with your health care provider.    Emergency Department Resource Guide 1) Find a Doctor and Pay Out of Pocket Although you won't have to find out who is covered by your insurance plan, it is a good idea to ask around and get recommendations. You will then need to call the office and see if the doctor you have chosen will accept you as a new patient and what types of options they offer for patients who are self-pay. Some doctors offer discounts or will set up payment plans for their patients who do not have insurance, but you will need to ask so you aren't surprised when you get to your appointment.  2) Contact Your Local Health Department Not all health departments have doctors that can see patients for sick visits, but many do, so it is worth a call to see if yours does. If you don't know where your local health department is, you can check in your phone book. The CDC also has a tool to help you locate your state's health department, and many state websites also have listings of all of their local health departments.  3) Find a Walk-in Clinic If your illness is not likely to be very severe or complicated, you may want to try a walk in clinic. These are popping up all over the country in pharmacies, drugstores, and shopping  centers. They're usually staffed by nurse practitioners or physician assistants that have been trained to treat common illnesses and complaints. They're usually fairly quick and inexpensive. However, if you have serious medical issues or chronic medical problems, these are probably not your best option.  No Primary Care Doctor: - Call Health Connect at  [phone] - they can help you locate a primary care doctor that  accepts your insurance, provides certain services, etc. - Physician Referral Service- [phone]  Chronic Pain Problems: Organization         Address  Phone   Notes  [facility]  [phone] Patients need to be referred by their primary care doctor.   Medication Assistance: Organization         Address  Phone   Notes  [facility]ication [facility] [address] [place], [place] [phone] --Must be a resident of [facility] [place] -- Must have NO insurance coverage whatsoever (no Medicaid/ Medicare, etc.) -- The pt. MUST  have a primary care doctor that directs their care regularly and follows them in the community   Med[facility]ist  [phone]   [employer]  [phone]    Agencies that provide inexpensive medical care: Organization         Address  Phone   Notes  [facility]  [phone]   [facility]    [phone]   [facility] [address] [place], [place] [address] [phone]   [facility] [address] [place], [place] [phone]   [facility]anned Parenthood    [phone]   [facility]    [phone]   [facility]  [address], [place] Phone:  [phone], Fax:  [phone] Hours of Operation:  9 am - 6 pm, M-F.  Also accepts Medicaid/Medicare and self-pay.  [facility]  [address], [place] Phone: [phone], Fax: [phone]. Hours of Operation:  8:30 am - 5:30 pm, M-F.  Also accepts Medicaid and self-pay.  [facility] [place] [address], [place] Phone: [phone]   [facility] [address] Natasha Bence [place], [place] [phone], Ext. [phone] Mondays & Thursdays: 7-9 AM.  First 15 patients are seen on a first come, first serve basis.    Medicaid-accepting [facility] Providers:  Organization         Address  Phone   Notes  [facility] [address], [place] [phone] Also accepts self-pay patients.  [facility] [address] Laurell Josephs [place], [place]  [phone]   [facility] [address], [place] [phone]   [facility] [address] [place] [phone]   Renaye Rakers [address], [place]   [phone] Only accepts [place] patients after they have their name applied to their card.   Self-Pay (no insurance) in [facility]:  Organization         Address  Phone   Notes  Sickle Cell Patients, [facility] [address] [place], [place] [phone]   [facility] [address] [place], [place] [phone]   [facility]  [address], [place] [phone]   [facility]/Dr. Osei-Bonsu  [address] [place] or [address], [place] [phone] Phone number for both [place] and [place] locations is the same.  [facility] [address], [place] [phone]   [facility] [address] [place] or [address] [phone]   [facility] [address] [place] [phone], phone; [phone], fax Sees patients 1st and 3rd Saturday of every month.  Must not qualify for public or private insurance (i.e.  Medicaid, Medicare, [place] Health Choice, Veterans' Benefits)  Household income should be no more than 200% of the poverty level The clinic cannot treat you if you are pregnant or think you are pregnant  Sexually transmitted diseases are not treated at the clinic.    Dental Care: Organization         Address  Phone  Notes  [facility] Department of [facility] [address] [place],  [place] [phone] Accepts children up to age 80 who are enrolled in Medicaid or [place] Health Choice; pregnant women with a Medicaid card; and children who have applied for Medicaid or [place] Health Choice, but were declined, whose parents can pay a reduced fee at time of service.  [facility] Department of [facility]  [address], [place] [phone] Accepts children up to age 69 who are enrolled in [place] or [place] Health Choice; pregnant women with a Medicaid card; and children who have applied for Medicaid or [place] Health Choice, but were declined, whose parents can pay a reduced fee at time of service.  [facility]  [address] [place], [place] [phone] Patients are seen by appointment only. Walk-ins are not accepted. [facility] will see patients 29 years of age and older. Monday - Tuesday (8am-5pm) Most Wednesdays (8:30-5pm) $30 per visit, cash only  [facility]  [address], [facility] [phone] Patients are seen by appointment only. Walk-ins are not accepted. [facility] will see patients 14 years of age and older. One Wednesday Evening (Monthly: Volunteer Based).  $30 per visit, cash only  [employer]  [phone] for adults; Children under age 87, call Graduate Pediatric Dentistry at [phone]. Children aged 79-14, please call [phone] to request a pediatric application.  Dental services are provided in all areas of dental care including fillings,  crowns and bridges, complete and partial dentures, implants, gum treatment, root canals, and extractions. Preventive care is also provided. Treatment is provided to both adults and children. Patients are selected via a lottery and there is often a waiting list.   [facility] [address], [place]  [phone] [email or web address]   [facility] [address] [place], [place] [phone], Ext. [phone] Second and Fourth Thursday of each month, opens at 6:30 AM; Clinic ends at 9 AM.  Patients are seen on a first-come first-served basis, and a limited number are seen during each clinic.   [facility]  [address] Ether [place], [place] [phone]   Eligibility Requirements You must have lived in [place], [place], or [place] counties for at least the last three months.   You cannot be eligible for state or federal sponsored [place], including [employer], [place], or [employer].   You generally cannot be eligible for healthcare insurance through your employer.    How to apply: Eligibility screenings are held every Tuesday and Wednesday afternoon from 1:00 pm until 4:00 pm. You do not need an appointment for the interview!  [facility] [address] [place], [place] [phone]   [facility]  [phone]   [facility] Health Department  [phone]   [facility] Health Department  [phone]    Behavioral Health Resources in the Community: Intensive Outpatient Programs Organization         Address  Phone  Notes  [facility] [address] [place], [place] [phone]   [facility] [address], [place], [place] [phone]   [facility] [address] [place], [place]  [phone]   [facility] [address]  [place], [place] [phone] or [phone]   Substance Abuse  Resources Organization         Address  Phone  Notes  [facility]  [phone]   [facility]  [phone]   [facility]  [phone]   [facility]  [phone]   Residential & Outpatient Substance Abuse Program  [phone]   Psychological Services Organization         Address  Phone  Notes  [facility]  [phone]- [phone]   [facility]  [phone]- [phone]   [facility] [address], [place] [phone] or [phone]    Mobile Crisis Teams Organization         Address  Phone  Notes  [facility], Mobile Crisis Care Unit  [phone]   [facility]  [address] [place], [place] [phone]   Doristine LocksSharon DeEsch [address] [place] [phone]    Self-Help/Support Groups Organization         Address  Phone             Notes  [facility] [place] - variety of support groups  336- [phone] Call for more information  Narcotics Anonymous (NA), [facility] [address], [place]  2 meetings at this location   Statisticianesidential Treatment Programs Organization         Address  Phone  Notes  [facility] [address],    [place]  [phone]   [facility]  [address] [place] [phone], [place], [place] [phone]   [facility] [address], [place] [phone] Admissions: 8am-3pm M-F  [facility] [address]    [place], [place] [phone]   [facility] [address] [place] [address], [place], [place] [phone]   [facility] [address]  [place], [place] [phone]   [facility] - Intensive Outpatient [address], [place], [place] [phone]   [facility] ([facility] [address]  [place], [place] [phone] or [phone]   [facility] ([facility]) [address] [place], [place] [phone] Accepts Medicaid  [facility] [address]  [place] [phone] Substance Abuse/Addiction Treatment   [facility] Behavioral Health Resources Organization         Address  Phone  Notes  [facility]  [phone](888) [phone]   Angie FavaJulie Brannon, PhD [address] [place], [place]   [phone] or [phone]   [facility]   [address] [place], [place] [phone]) [phone]   [facility] [address], [place], [place] [phone] Insurance/Medicaid/sponsorship through [facility]  [facility] Families [address]                                    [place], [place] [phone] Therapy/tele-psych/case  [facility] [address].   [place], [place] [phone]    Dr. Lolly MustacheArfeen  [phone]   [facility]  [employer] [facility] 1) [address], [place] 2) [address], [place] 3)  [address], [place] [phone]([phone]  [phone]   [facility] Child Abuse Hotline [phone]) [phone] or [phone] (After Hours)

## 2014-03-01 NOTE — ED Provider Notes (Signed)
CSN: [id number]     Arrival date & time 03/01/14  1014 History   First MD Initiated Contact with Patient 03/01/14 1031     No chief complaint on file.    (Consider location/radiation/quality/duration/timing/severity/associated sxs/prior Treatment) Patient is a 28 y.o. male presenting with tooth pain. The history is provided by the patient.  Dental Pain Location:  Generalized Quality:  Throbbing Severity:  Severe Onset quality:  Gradual Duration:  2 days Timing:  Constant Progression:  Worsening Chronicity:  New Context: not abscess   Relieved by:  Nothing Worsened by:  Cold food/drink Ineffective treatments:  NSAIDs   History reviewed. No pertinent past medical history. History reviewed. No pertinent past surgical history. History reviewed. No pertinent family history. History  Substance Use Topics  . Smoking status: Current Every Day Smoker -- 1.00 packs/day    Types: Cigarettes  . Smokeless tobacco: Not on file  . Alcohol Use: No    Review of Systems  All other systems reviewed and are negative.     Allergies  Ceclor; Sequinavir; Toradol; and Ultram  Home Medications   Prior to Admission medications   Medication Sig Start Date End Date Taking? Authorizing Provider  clindamycin (CLEOCIN) 150 MG capsule Take 3 capsules (450 mg total) by mouth 3 (three) times daily. 12/21/13   Dagmar HaitWilliam Blair Walden, MD  HYDROcodone-acetaminophen (NORCO/VICODIN) 5-325 MG per tablet Take 1 tablet by mouth every 6 (six) hours as needed for moderate pain. 12/21/13   Dagmar HaitWilliam Blair Walden, MD   BP 148/82  Pulse 68  Temp(Src) 98.1 F (36.7 C) (Oral)  Resp 20  Ht 6\' 1"  (1.854 m)  Wt 225 lb (102.059 kg)  BMI 29.69 kg/m2  SpO2 100% Physical Exam  Nursing note and vitals reviewed. Constitutional: He is oriented to person, place, and time. He appears well-developed and well-nourished. No distress.  HENT:  Head: Normocephalic and atraumatic.  There are multiple significantly decayed  teeth throughout the mouth. There are no obvious abscesses. There is no swelling of the face or submandibular soft tissues. There is no crepitus palpable.  Musculoskeletal: Normal range of motion.  Neurological: He is alert and oriented to person, place, and time.  Skin: Skin is warm and dry. He is not diaphoretic.    ED Course  Procedures (including critical care time) Labs Review Labs Reviewed - No data to display  Imaging Review No results found.   EKG Interpretation None      MDM   Final diagnoses:  None    Dental caries. Will treat with clindamycin and pain medication. He is to call tomorrow followup with a dentist.    Geoffery Lyonsouglas Delo, MD 03/01/14 [phone]

## 2014-03-01 NOTE — ED Notes (Signed)
Pt c/o of tooth pain on right and left back sides of mouth x 3 days.

## 2014-07-03 ENCOUNTER — Emergency Department (HOSPITAL_BASED_OUTPATIENT_CLINIC_OR_DEPARTMENT_OTHER)
Admission: EM | Admit: 2014-07-03 | Discharge: 2014-07-03 | Disposition: A | Discharge status: Home / Self Care | Payer: No Typology Code available for payment source | Attending: Emergency Medicine | Admitting: Emergency Medicine

## 2014-07-03 ENCOUNTER — Encounter (HOSPITAL_BASED_OUTPATIENT_CLINIC_OR_DEPARTMENT_OTHER): Payer: Self-pay | Admitting: Emergency Medicine

## 2014-07-03 DIAGNOSIS — R1111 Vomiting without nausea: Secondary | ICD-10-CM | POA: Insufficient documentation

## 2014-07-03 DIAGNOSIS — Z720 Tobacco use: Secondary | ICD-10-CM | POA: Insufficient documentation

## 2014-07-03 DIAGNOSIS — R1110 Vomiting, unspecified: Secondary | ICD-10-CM | POA: Diagnosis present

## 2014-07-03 DIAGNOSIS — Z792 Long term (current) use of antibiotics: Secondary | ICD-10-CM | POA: Insufficient documentation

## 2014-07-03 MED ORDER — ONDANSETRON 4 MG PO TBDP
ORAL_TABLET | ORAL | Status: AC
Start: 2014-07-03 — End: ?

## 2014-07-03 NOTE — ED Provider Notes (Signed)
CSN: [id number]     Arrival date & time 07/03/14  1433 History   First MD Initiated Contact with Patient 07/03/14 1501     Chief Complaint  Patient presents with  . Emesis     (Consider location/radiation/quality/duration/timing/severity/associated sxs/prior Treatment) Patient is a 28 y.o. male presenting with vomiting. The history is provided by the patient.  Emesis Severity:  Mild Duration:  2 days Timing:  Intermittent Quality:  Stomach contents Able to tolerate:  Liquids and solids Progression:  Improving Chronicity:  New Recent urination:  Normal Relieved by:  Nothing Worsened by:  Nothing tried Ineffective treatments:  None tried Associated symptoms: no abdominal pain, no diarrhea and no headaches     History reviewed. No pertinent past medical history. History reviewed. No pertinent past surgical history. No family history on file. History  Substance Use Topics  . Smoking status: Current Every Day Smoker -- 1.00 packs/day    Types: Cigarettes  . Smokeless tobacco: Not on file  . Alcohol Use: No    Review of Systems  Constitutional: Negative for fever.  HENT: Negative for drooling and rhinorrhea.   Eyes: Negative for pain.  Respiratory: Negative for cough and shortness of breath.   Cardiovascular: Negative for chest pain and leg swelling.  Gastrointestinal: Positive for nausea and vomiting. Negative for abdominal pain and diarrhea.  Genitourinary: Negative for dysuria and hematuria.  Musculoskeletal: Negative for gait problem and neck pain.  Skin: Negative for color change.  Neurological: Negative for numbness and headaches.  Hematological: Negative for adenopathy.  Psychiatric/Behavioral: Negative for behavioral problems.  All other systems reviewed and are negative.     Allergies  Ceclor; Sequinavir; Toradol; and Ultram  Home Medications   Prior to Admission medications   Medication Sig Start Date End Date Taking? Authorizing Provider   clindamycin (CLEOCIN) 150 MG capsule Take 3 capsules (450 mg total) by mouth 3 (three) times daily. 12/21/13   Elwin MochaBlair Walden, MD  clindamycin (CLEOCIN) 300 MG capsule Take 1 capsule (300 mg total) by mouth 4 (four) times daily. X 7 days 03/01/14   Geoffery Lyonsouglas Delo, MD  HYDROcodone-acetaminophen (NORCO) 5-325 MG per tablet Take 1-2 tablets by mouth every 4 (four) hours as needed. 03/01/14   Geoffery Lyonsouglas Delo, MD  HYDROcodone-acetaminophen (NORCO/VICODIN) 5-325 MG per tablet Take 1 tablet by mouth every 6 (six) hours as needed for moderate pain. 12/21/13   Elwin MochaBlair Walden, MD   BP 115/69  Pulse 80  Temp(Src) 98.7 F (37.1 C) (Oral)  Resp 20  Ht 6\' 2"  (1.88 m)  Wt 260 lb (117.935 kg)  BMI 33.37 kg/m2  SpO2 99% Physical Exam  Nursing note and vitals reviewed. Constitutional: He is oriented to person, place, and time. He appears well-developed and well-nourished.  HENT:  Head: Normocephalic and atraumatic.  Right Ear: External ear normal.  Left Ear: External ear normal.  Nose: Nose normal.  Mouth/Throat: Oropharynx is clear and moist. No oropharyngeal exudate.  Eyes: Conjunctivae and EOM are normal. Pupils are equal, round, and reactive to light.  Neck: Normal range of motion. Neck supple.  Cardiovascular: Normal rate, regular rhythm, normal heart sounds and intact distal pulses.  Exam reveals no gallop and no friction rub.   No murmur heard. Pulmonary/Chest: Effort normal and breath sounds normal. No respiratory distress. He has no wheezes.  Abdominal: Soft. Bowel sounds are normal. He exhibits no distension. There is no tenderness. There is no rebound and no guarding.  Musculoskeletal: Normal range of motion. He exhibits no edema and  no tenderness.  Neurological: He is alert and oriented to person, place, and time.  Skin: Skin is warm and dry.  Psychiatric: He has a normal mood and affect. His behavior is normal.    ED Course  Procedures (including critical care time) Labs Review Labs Reviewed -  No data to display  Imaging Review No results found.   EKG Interpretation None      MDM   Final diagnoses:  Non-intractable vomiting without nausea, vomiting of unspecified type    3:35 PM 28 y.o. male who presents with nausea and vomiting which began 2 days ago. He denies any fevers. Emesis consistent with stomach contents without blood. He states that his symptoms are resolving and believes him to be associated with suspicious food intake. He states that his wife also had similar symptoms. He is afebrile and vital signs are unremarkable here. He is requesting a note for work.  3:36 PM:  I have discussed the diagnosis/risks/treatment options with the patient and believe the pt to be eligible for discharge home to follow-up with his pcp as needed. We also discussed returning to the ED immediately if new or worsening sx occur. We discussed the sx which are most concerning (e.g., inability to tolerate po, fever) that necessitate immediate return. Medications administered to the patient during their visit and any new prescriptions provided to the patient are listed below.  Medications given during this visit Medications - No data to display  New Prescriptions   ONDANSETRON (ZOFRAN ODT) 4 MG DISINTEGRATING TABLET    4mg  ODT q4 hours prn nausea/vomit       Purvis SheffieldForrest Jervis Trapani, MD 07/03/14 1539

## 2014-07-03 NOTE — Discharge Instructions (Signed)

## 2014-07-03 NOTE — ED Notes (Signed)
Vomited yesterday. States it started after eating the night before at [employer]. No vomiting today but states he needs a work note to go back to work.

## 2014-12-08 ENCOUNTER — Emergency Department (HOSPITAL_BASED_OUTPATIENT_CLINIC_OR_DEPARTMENT_OTHER)
Admission: EM | Admit: 2014-12-08 | Discharge: 2014-12-08 | Disposition: A | Discharge status: Home / Self Care | Payer: Self-pay | Attending: Emergency Medicine | Admitting: Emergency Medicine

## 2014-12-08 ENCOUNTER — Encounter (HOSPITAL_BASED_OUTPATIENT_CLINIC_OR_DEPARTMENT_OTHER): Payer: Self-pay | Admitting: *Deleted

## 2014-12-08 DIAGNOSIS — S025XXA Fracture of tooth (traumatic), initial encounter for closed fracture: Secondary | ICD-10-CM

## 2014-12-08 DIAGNOSIS — Z792 Long term (current) use of antibiotics: Secondary | ICD-10-CM | POA: Insufficient documentation

## 2014-12-08 DIAGNOSIS — K0381 Cracked tooth: Secondary | ICD-10-CM | POA: Insufficient documentation

## 2014-12-08 DIAGNOSIS — Z720 Tobacco use: Secondary | ICD-10-CM | POA: Insufficient documentation

## 2014-12-08 DIAGNOSIS — K029 Dental caries, unspecified: Secondary | ICD-10-CM | POA: Insufficient documentation

## 2014-12-08 DIAGNOSIS — Z79899 Other long term (current) drug therapy: Secondary | ICD-10-CM | POA: Insufficient documentation

## 2014-12-08 DIAGNOSIS — K088 Other specified disorders of teeth and supporting structures: Secondary | ICD-10-CM | POA: Insufficient documentation

## 2014-12-08 MED ORDER — CLINDAMYCIN HCL 150 MG PO CAPS: 150.0000 mg | ORAL_CAPSULE | Freq: Four times a day (QID) | ORAL | Status: AC

## 2014-12-08 MED ORDER — OXYCODONE-ACETAMINOPHEN 5-325 MG PO TABS: 1.0000 | ORAL_TABLET | Freq: Four times a day (QID) | ORAL | Status: AC | PRN

## 2014-12-08 MED ORDER — LIDOCAINE VISCOUS 2 % MT SOLN
OROMUCOSAL | Status: AC
Start: 2014-12-08 — End: 2014-12-08
  Administered 2014-12-08: 15 mL
  Filled 2014-12-08: qty 15

## 2014-12-08 MED ORDER — OXYCODONE-ACETAMINOPHEN 5-325 MG PO TABS
1.0000 | ORAL_TABLET | Freq: Once | ORAL | Status: AC
  Administered 2014-12-08: 1 via ORAL
  Filled 2014-12-08: qty 1

## 2014-12-08 NOTE — ED Notes (Signed)
Pt friend standing outside of room with door wide open. Asked friend if she needed anything. She stated no. I instructed pt friend she must stay in room with door closed for pt privacy if she wants to stay in back. Friend went back into room and closed door.

## 2014-12-08 NOTE — ED Notes (Signed)
Pt friend came out of room wants to know how much longer. Instructed PA has signed up for pt that it should not be long.

## 2014-12-08 NOTE — Discharge Instructions (Signed)
Patients with Medicaid: [facility] [phone], [phone] [address], [phone]  If unable to pay, or uninsured, contact [facility] [phone]) or [facility] [phone] in [place], [phone] in [facility] [place]) to become qualified for the adult dental clinic  Other Low-Cost Community Dental Services: [facility]- [address], [place], [place], [address]    [phone], Ext. [phone]    2nd and 4th Thursday of the month at 6:30am    10 clients each day by appointment, can sometimes see walk-in patients if someone does not show for an appointment [facility]- [address], [place], [place], [address]    [phone] [facility]- [address], [place], [place], [address]    [phone]  [facility]- [phone] [facility]- [phone] [phone]- [phone]   Emergency Department Resource Guide 1) Find a Doctor and Pay Out of Pocket Although you won't have to find out who is covered by your insurance plan, it is a good idea to ask around and get recommendations. You will then need to call the office and see if the doctor you have chosen will accept you as a new patient and what types of options they offer for patients who are self-pay. Some doctors offer discounts or will set up payment plans for their patients who do not have insurance, but you will need to ask so you aren't surprised when you get to your appointment.  2) Contact Your Local Health Department Not all health departments have doctors that can see patients for sick visits, but many do, so it is worth a call to see if yours does. If you don't know where your local health department is, you can check in your phone book. The CDC also has a tool to help you locate your state's health department, and many state websites also have listings of all of their local health departments.  3)  Find a Walk-in Clinic If your illness is not likely to be very severe or complicated, you may want to try a walk in clinic. These are popping up all over the country in pharmacies, drugstores, and shopping centers. They're usually staffed by nurse practitioners or physician assistants that have been trained to treat common illnesses and complaints. They're usually fairly quick and inexpensive. However, if you have serious medical issues or chronic medical problems, these are probably not your best option.  No Primary Care Doctor: - Call Health Connect at  [phone] - they can help you locate a primary care doctor that  accepts your insurance, provides certain services, etc. - Physician Referral Service- [phone]  Chronic Pain Problems: Organization         Address  Phone   Notes  [facility]  [phone] Patients need to be referred by their primary care doctor.   Medication Assistance: Organization         Address  Phone   Notes  [facility] [address] [place], [place] [address] [phone] --Must be a resident of [facility] -- Must have NO insurance coverage whatsoever (no Medicaid/ Medicare, etc.) -- The pt. MUST have a primary care doctor that directs their care regularly and follows them in the community   Med[facility]ist  [phone]   [employer]  [phone]    Agencies that provide inexpensive medical care: Organization  Address  Phone   Notes  [facility]  [phone]   [facility]    [phone]   [facility] [address] [place], [place] [phone]   [facility] [address] [place] [place] [phone]   Planned Parenthood    [phone]   [facility]    [phone]   [facility]  [address], [place] Phone:  [phone], Fax:  [phone] Hours of Operation:  9 am - 6 pm, M-F.  Also accepts Medicaid/Medicare and self-pay.  [facility]  [address], [place] Phone: [phone], Fax: [phone]. Hours of Operation:  8:30 am - 5:30 pm, M-F.  Also accepts Medicaid and self-pay.  [facility] [place] [address], [place] Phone: [phone]   [facility] [address] Natasha [address]ence [place], [place] [phone], Ext. [phone] Mondays & Thursdays: 7-9 AM.  First 15 patients are seen on a first come, first serve basis.    Medicaid-accepting [facility] Providers:  Organization         Address  Phone   Notes  [facility] [address], [place] [phone] Also accepts self-pay patients.  [facility] [address] Laurell Josephs [place], [place]  [phone]   [facility] [address], [place] [phone]   [facility] [address] [place] [phone]   Renaye Rakers [address], [place]   [phone] Only accepts [place] Access [place] patients after they have their name applied to their card.   Self-Pay (no insurance) in [facility]:  Organization         Address  Phone   Notes  Sickle Cell Patients, [facility] [address] [place], [place] [phone]   [facility] [address] [place], [place] [phone]   [facility] [place]  [address], [place] [phone]   [facility]/Dr. Osei-Bonsu  [address], [place] or [address], [place] [phone] Phone number for both [place] and [place] locations is the same.  [facility] [address] [place] [phone]   [facility] [address], [place] or [address] [phone]     [facility] [address] [place] [phone], phone; [phone], fax Sees patients 1st and 3rd Saturday of every month.  Must not qualify for public or private insurance (i.e. Medicaid, Medicare, [place] Health Choice, Veterans' Benefits)  Household income should be no more than 200% of the poverty level The clinic cannot treat you if you are pregnant or think you are pregnant  Sexually transmitted diseases are not treated at the clinic.    Dental Care: Organization         Address  Phone  Notes  [facility] [address] [place], [place] [phone] Accepts children up to age 7 who are enrolled in [place] or [place] Health Choice; pregnant women with a Medicaid card; and children who have applied for Medicaid or [place] Health Choice, but were declined, whose parents can pay a reduced fee at  time of service.  [facility] [place]  [address], [place] [phone] Accepts children up to age 30 who are enrolled in [place] or [place] Health Choice; pregnant women with a Medicaid card; and children who have applied for Medicaid or [place] Health Choice, but were declined, whose parents can pay a reduced fee at time of service.  [facility]  [address], [place] [phone] Patients are seen by appointment only. Walk-ins are not accepted. [facility] will see patients 30 years of age and older. Monday - Tuesday (8am-5pm) Most Wednesdays (8:30-5pm) $30 per visit, cash only  [facility]  [address], [facility] [phone] Patients are seen by appointment only. Walk-ins are not accepted. [facility] will see patients 10218 years of age and older. One Wednesday Evening (Monthly: Volunteer Based).  $30 per visit, cash only  [employer] [facility]  [phone] for adults; Children under age 504, call  Graduate Pediatric Dentistry at [phone]. Children aged 644-14, please call [phone] to request a pediatric application.  Dental services are provided in all areas of dental care including fillings, crowns and bridges, complete and partial dentures, implants, gum treatment, root canals, and extractions. Preventive care is also provided. Treatment is provided to both adults and children. Patients are selected via a lottery and there is often a waiting list.   [facility] [address], [place]  [phone] [email or web address]   [facility] [address], [place], [place] [phone], Ext. [phone] Second and Fourth Thursday of each month, opens at 6:30 AM; Clinic ends at 9 AM.  Patients are seen on a first-come first-served basis, and a limited number are seen during each clinic.   [facility]  [address], [place], [place] [phone]   Eligibility Requirements You must have lived in [place], [place], or [place] counties for at least the last three months.   You cannot be eligible for state or federal sponsored National Cityhealthcare insurance, including CIGNAVeterans Administration, [place]icaid, or [employer]Medicare.   You generally cannot be eligible for healthcare insurance through your employer.    How to apply: Eligibility screenings are held every Tuesday and Wednesday afternoon from 1:00 pm until 4:00 pm. You do not need an appointment for the interview!  [facility] [address], [place], [place] [phone]   [facility]  [phone]   [facility]  [phone]   [facility]  [phone]    Behavioral Health Resources in the Community: Intensive Outpatient Programs Organization         Address  Phone  Notes  [facility] [address], [place], [place] [phone]   [facility] [address], [place], [place]  [phone]   [facility] [address], [place], [place]  [phone]   [facility] [address],  [place], [place] [phone] or [phone]   Substance Abuse Resources Organization         Address  Phone  Notes  Al[facility] Drug Services  [phone]   [facility]  [phone]   [facility]  [phone]   [facility]  [phone]   Residential & Outpatient Substance Abuse Program  [phone]   Psychological Services Organization         Address  Phone  Notes  [facility] [place]  [phone]   [place] [address], [place] or [phone]    Mobile Crisis Teams Organization         Address  Phone  Notes  [facility], Mobile Crisis Care Unit  [phone]   [facility]  [address] [place], [place]   [place] Levels [address], [place] [phone]    Self-Help/Support Groups Organization         Address  Phone             Notes  [place] - variety of support groups  [place] Call for more information  Narcotics Anonymous (NA), [facility] [address], [employer] [place]  2 meetings at this location   Special educational needs teacher         Address  Phone  Notes  ASAP Residential Treatment [place],    [place]  [phone]   [facility]  [address] [place] [address]35701, [place], [place]   [place], [place] [phone] Admissions: 8am-3pm M-F  Incentives Substance [place] [address]    [place], [place] [phone]   [facility] [address] [place], [place], [place]   [facility] [address]  [place], [place]   [facility]ight Programs - Intensive Outpatient [address], [place], [place]   [facility] [place].,  [place], [place] [phone] or [phone]   [facility] (RTS) [address] [place], [place] Accepts Medicaid  [facility] [place] [address],  [place] [phone] Substance Abuse/Addiction Treatment   [facility] Organization         Address  Phone  Notes  [facility]  [phone]   Domenic Schwab, PhD [address] Arlis Porta [place], [place]   [phone] or [phone]   [place], [place] [phone]   [facility] [address], [place], [place] [phone] Insurance/Medicaid/sponsorship through [facility] and Families [address] [place]                                    [place], [place] [phone] [place] [address], [place] [phone]    Dr. Adele Schilder  [phone]   [facility] 1) [address] [place] 2) [place] 3)  [place] [address], [place] [phone]  [phone]   [place] [phone] or [phone] (After Hours)

## 2014-12-08 NOTE — ED Provider Notes (Signed)
CSN: [id number]     Arrival date & time 12/08/14  1951 History   First MD Initiated Contact with Patient 12/08/14 2001     Chief Complaint  Patient presents with  . Dental Pain     (Consider location/radiation/quality/duration/timing/severity/associated sxs/prior Treatment) HPI Pt is a 28yo male presenting to ED with c/o sudden onset dental pain that started after he cracked his tooth eating cereal tonight. Pt states that pain is constant, throbbing and aching, 10/10. States he has had a bad tooth for a while but it has never hurt this bad. No pain medication PTA. Denies fever, chills, n/v/d.   History reviewed. No pertinent past medical history. History reviewed. No pertinent past surgical history. No family history on file. History  Substance Use Topics  . Smoking status: Current Every Day Smoker -- 1.00 packs/day    Types: Cigarettes  . Smokeless tobacco: Not on file  . Alcohol Use: No    Review of Systems  Constitutional: Negative for fever and chills.  HENT: Positive for dental problem. Negative for sore throat, trouble swallowing and voice change.   Gastrointestinal: Negative for nausea and vomiting.  All other systems reviewed and are negative.     Allergies  Ceclor; Sequinavir; Toradol; and Ultram  Home Medications   Prior to Admission medications   Medication Sig Start Date End Date Taking? Authorizing Provider  clindamycin (CLEOCIN) 150 MG capsule Take 3 capsules (450 mg total) by mouth 3 (three) times daily. 12/21/13   Elwin Mocha, MD  clindamycin (CLEOCIN) 150 MG capsule Take 1 capsule (150 mg total) by mouth every 6 (six) hours. 12/08/14   Junius Finner, PA-C  clindamycin (CLEOCIN) 300 MG capsule Take 1 capsule (300 mg total) by mouth 4 (four) times daily. X 7 days 03/01/14   Geoffery Lyons, MD  HYDROcodone-acetaminophen (NORCO) 5-325 MG per tablet Take 1-2 tablets by mouth every 4 (four) hours as needed. 03/01/14   Geoffery Lyons, MD  HYDROcodone-acetaminophen  (NORCO/VICODIN) 5-325 MG per tablet Take 1 tablet by mouth every 6 (six) hours as needed for moderate pain. 12/21/13   Elwin Mocha, MD  ondansetron (ZOFRAN ODT) 4 MG disintegrating tablet  ODT q4 hours prn nausea/vomit 07/03/14   Purvis Sheffield, MD  oxyCODONE-acetaminophen (PERCOCET/ROXICET) 5-325 MG per tablet Take 1 tablet by mouth every 6 (six) hours as needed for severe pain. 12/08/14   Junius Finner, PA-C   BP 153/95 mmHg  Pulse 78  Temp(Src) 98.8 F (37.1 C) (Oral)  Resp 20  Ht  (1.88 m)  Wt 260 lb (117.935 kg)  BMI 33.37 kg/m2  SpO2 100% Physical Exam  Constitutional: He is oriented to person, place, and time. He appears well-developed and well-nourished.  HENT:  Head: Normocephalic and atraumatic.  Mouth/Throat: Uvula is midline, oropharynx is clear and moist and mucous membranes are normal. No trismus in the jaw. Abnormal dentition. Dental caries present. No dental abscesses or uvula swelling.    Left upper last molar, dental decay and small chip in tooth. No gingival abscess.  No airway involvement.   Eyes: EOM are normal.  Neck: Normal range of motion.  Cardiovascular: Normal rate.   Pulmonary/Chest: Effort normal.  Musculoskeletal: Normal range of motion.  Neurological: He is alert and oriented to person, place, and time.  Skin: Skin is warm and dry.  Psychiatric: He has a normal mood and affect. His behavior is normal.  Nursing note and vitals reviewed.   ED Course  Procedures (including critical care time) Labs Review Labs  Reviewed - No data to display  Imaging Review No results found.   EKG Interpretation None      MDM   Final diagnoses:  Pain due to dental caries  Dental decay  Chipped tooth, closed, initial encounter    Pt is a 28yo male presenting to ED with c/o dental pain due to dental decay and cracked tooth. No gingival abscess on exam. Rx: clindamycin and percocet. Pt given viscous lidocaine to swish and spit while in ED as pt  requested "immediate relief" but declined dental block. Advised to f/u with Dr. Lucky CowboyKnox, DDS. Return precautions provided. Dental resource guide also provided. Pt verbalized understanding and agreement with tx plan.    Junius Finnerrin O'Malley, PA-C 12/08/14 2154  Gwyneth SproutWhitney Plunkett, MD 12/08/14 [phone]

## 2014-12-08 NOTE — ED Notes (Signed)
Broke his tooth tonight while eating cereal. Crying at triage.

## 2015-07-28 ENCOUNTER — Encounter (HOSPITAL_BASED_OUTPATIENT_CLINIC_OR_DEPARTMENT_OTHER): Payer: Self-pay | Admitting: *Deleted

## 2015-07-28 ENCOUNTER — Emergency Department (HOSPITAL_BASED_OUTPATIENT_CLINIC_OR_DEPARTMENT_OTHER)
Admission: EM | Admit: 2015-07-28 | Discharge: 2015-07-28 | Disposition: A | Discharge status: Home / Self Care | Payer: Self-pay | Attending: Emergency Medicine | Admitting: Emergency Medicine

## 2015-07-28 DIAGNOSIS — M546 Pain in thoracic spine: Secondary | ICD-10-CM | POA: Insufficient documentation

## 2015-07-28 DIAGNOSIS — F17210 Nicotine dependence, cigarettes, uncomplicated: Secondary | ICD-10-CM | POA: Insufficient documentation

## 2015-07-28 DIAGNOSIS — G5601 Carpal tunnel syndrome, right upper limb: Secondary | ICD-10-CM | POA: Insufficient documentation

## 2015-07-28 DIAGNOSIS — Z792 Long term (current) use of antibiotics: Secondary | ICD-10-CM | POA: Insufficient documentation

## 2015-07-28 DIAGNOSIS — M791 Myalgia, unspecified site: Secondary | ICD-10-CM

## 2015-07-28 HISTORY — DX: Dorsalgia, unspecified: M54.9

## 2015-07-28 MED ORDER — IBUPROFEN 800 MG PO TABS
800.0000 mg | ORAL_TABLET | Freq: Once | ORAL | Status: AC
  Administered 2015-07-28: 800 mg via ORAL
  Filled 2015-07-28: qty 1

## 2015-07-28 MED ORDER — DIAZEPAM 2 MG PO TABS
2.0000 mg | ORAL_TABLET | Freq: Once | ORAL | Status: AC
  Administered 2015-07-28: 2 mg via ORAL
  Filled 2015-07-28: qty 1

## 2015-07-28 NOTE — ED Notes (Signed)
Pt reports hx of back pain and right arm numbness x 3 months after injury at work- pt states this morning his arm was hurting and "got a catch in my back"- also states having increasing pain and tingling in right arm today since then- states having difficulty straightening arm

## 2015-07-28 NOTE — ED Provider Notes (Signed)
CSN: [id number]     Arrival date & time 07/28/15  1816 History  By signing my name below, I, Soijett Blue, attest that this documentation has been prepared under the direction and in the presence of Lyndal Pulley, MD. Electronically Signed: Soijett Blue, ED Scribe. 07/28/2015. 7:25 PM.   Chief Complaint  Patient presents with  . Back Pain  . Arm Pain      Patient is a 29 y.o. male presenting with back pain and arm pain. The history is provided by the patient. No language interpreter was used.  Back Pain Location:  Thoracic spine Quality:  Stabbing Radiates to:  Does not radiate Pain severity:  Moderate Pain is:  Unable to specify Onset quality:  Sudden Duration:  1 day Timing:  Constant Progression:  Unchanged Chronicity:  Recurrent Context: lifting heavy objects   Relieved by:  Nothing Worsened by:  Movement and bending Ineffective treatments:  NSAIDs Associated symptoms: numbness   Arm Pain This is a new problem. The current episode started 12 to 24 hours ago. The problem occurs constantly. The problem has not changed since onset.The symptoms are aggravated by bending. Nothing relieves the symptoms. Treatments tried: ibuprofen. The treatment provided no relief.  HPI Comments: Angel Mccoy is a 29 y.o. male who presents to the Emergency Department complaining of moderate, constant, mid back pain onset 2 months worsening this morning. He states that he got out of bed when the back pain caught him and it was a stabbing sensation. He states that he is having associated symptoms of right arm pain/numbness. He states that he has tried ibuprofen with no relief for his symptoms. Pt denies any other symptoms.    Past Medical History  Diagnosis Date  . Back pain    Past Surgical History  Procedure Laterality Date  . Tonsillectomy     No family history on file. Social History  Substance Use Topics  . Smoking status: Current Every Day Smoker -- 1.00 packs/day    Types:  Cigarettes  . Smokeless tobacco: Former Neurosurgeon  . Alcohol Use: No    Review of Systems  Musculoskeletal: Positive for back pain and arthralgias.  Neurological: Positive for numbness.  All other systems reviewed and are negative.   Allergies  Ceclor; Sequinavir; Toradol; and Ultram  Home Medications   Prior to Admission medications   Medication Sig Start Date End Date Taking? Authorizing Provider  clindamycin (CLEOCIN) 150 MG capsule Take 3 capsules (450 mg total) by mouth 3 (three) times daily. 12/21/13   Elwin Mocha, MD  clindamycin (CLEOCIN) 150 MG capsule Take 1 capsule (150 mg total) by mouth every 6 (six) hours. 12/08/14   Junius Finner, PA-C  clindamycin (CLEOCIN) 300 MG capsule Take 1 capsule (300 mg total) by mouth 4 (four) times daily. X 7 days 03/01/14   Geoffery Lyons, MD  HYDROcodone-acetaminophen (NORCO) 5-325 MG per tablet Take 1-2 tablets by mouth every 4 (four) hours as needed. 03/01/14   Geoffery Lyons, MD  HYDROcodone-acetaminophen (NORCO/VICODIN) 5-325 MG per tablet Take 1 tablet by mouth every 6 (six) hours as needed for moderate pain. 12/21/13   Elwin Mocha, MD  ondansetron (ZOFRAN ODT) 4 MG disintegrating tablet  ODT q4 hours prn nausea/vomit 07/03/14   Purvis Sheffield, MD  oxyCODONE-acetaminophen (PERCOCET/ROXICET) 5-325 MG per tablet Take 1 tablet by mouth every 6 (six) hours as needed for severe pain. 12/08/14   Junius Finner, PA-C   BP 128/64 mmHg  Pulse 71  Temp(Src) 98.9 F (37.2 C) (Oral)  Resp 18  Ht 6\' 1"  (1.854 m)  Wt 276 lb (125.193 kg)  BMI 36.42 kg/m2  SpO2 100% Physical Exam  Constitutional: He is oriented to person, place, and time. He appears well-developed and well-nourished. No distress.  HENT:  Head: Normocephalic and atraumatic.  Eyes: EOM are normal.  Neck: Neck supple.  Cardiovascular: Normal rate.   Pulmonary/Chest: Effort normal. No respiratory distress.  Musculoskeletal: Normal range of motion.  Mild bilateral paraspinal muscle  tenderness.  positive tinel sign on right.   Neurological: He is alert and oriented to person, place, and time.  Skin: Skin is warm and dry.  Psychiatric: He has a normal mood and affect. His behavior is normal.  Nursing note and vitals reviewed.   ED Course  Procedures (including critical care time) DIAGNOSTIC STUDIES: Oxygen Saturation is 100% on RA, nl by my interpretation.    COORDINATION OF CARE: 7:21 PM Discussed treatment plan with pt at bedside which includes valium injection, ibuprofen, and referral to [place] and wellness, and pt agreed to plan.    Labs Review Labs Reviewed - No data to display  Imaging Review No results found.    EKG Interpretation None      MDM   Final diagnoses:  Muscle pain  Carpal tunnel syndrome, right    29 y.o. male presents with back pain ongoing for multiple months that has not ben responsive to intermittent NSAID use. He does not want to extend his elbow because of muscle strain of right biceps. He has signs of overuse including carpal tunnel symptoms and physical exam findings on right. No neurologic deficits or signs of major trauma. No red flag symptoms of fever, weight loss, saddle anesthesia, weakness fecal/urinary incontinence or urinary retention.  Recommended supportive care measures with a trial of scheduled NSAIDs and Patient needs to establish primary care in the area and was provided contact information to do so. Given a single low dose of valium to help with symptoms tonight but explained to him this would not provide long-term relief.   I personally performed the services described in this documentation, which was scribed in my presence. The recorded information has been reviewed and is accurate.     Lyndal Pulleyaniel Tinna Kolker, MD 07/29/15 [phone]

## 2015-07-28 NOTE — ED Notes (Signed)
MD at bedside. 

## 2015-07-28 NOTE — Discharge Instructions (Signed)
Carpal Tunnel Syndrome Carpal tunnel syndrome is a condition that causes pain in your hand and arm. The carpal tunnel is a narrow area located on the palm side of your wrist. Repeated wrist motion or certain diseases may cause swelling within the tunnel. This swelling pinches the main nerve in the wrist (median nerve). CAUSES  This condition may be caused by:   Repeated wrist motions.  Wrist injuries.  Arthritis.  A cyst or tumor in the carpal tunnel.  Fluid buildup during pregnancy. Sometimes the cause of this condition is not known.  RISK FACTORS This condition is more likely to develop in:   People who have jobs that cause them to repeatedly move their wrists in the same motion, such as butchers and cashiers.  Women.  People with certain conditions, such as:  Diabetes.  Obesity.  An underactive thyroid (hypothyroidism).  Kidney failure. SYMPTOMS  Symptoms of this condition include:   A tingling feeling in your fingers, especially in your thumb, index, and middle fingers.  Tingling or numbness in your hand.  An aching feeling in your entire arm, especially when your wrist and elbow are bent for long periods of time.  Wrist pain that goes up your arm to your shoulder.  Pain that goes down into your palm or fingers.  A weak feeling in your hands. You may have trouble grabbing and holding items. Your symptoms may feel worse during the night.  DIAGNOSIS  This condition is diagnosed with a medical history and physical exam. You may also have tests, including:   An electromyogram (EMG). This test measures electrical signals sent by your nerves into the muscles.  X-rays. TREATMENT  Treatment for this condition includes:  Lifestyle changes. It is important to stop doing or modify the activity that caused your condition.  Physical or occupational therapy.  Medicines for pain and inflammation. This may include medicine that is injected into your wrist.  A wrist  splint.  Surgery. HOME CARE INSTRUCTIONS  If You Have a Splint:  Wear it as told by your health care provider. Remove it only as told by your health care provider.  Loosen the splint if your fingers become numb and tingle, or if they turn cold and blue.  Keep the splint clean and dry. General Instructions  Take over-the-counter and prescription medicines only as told by your health care provider.  Rest your wrist from any activity that may be causing your pain. If your condition is work related, talk to your employer about changes that can be made, such as getting a wrist pad to use while typing.  If directed, apply ice to the painful area:  Put ice in a plastic bag.  Place a towel between your skin and the bag.  Leave the ice on for 20 minutes, 2-3 times per day.  Keep all follow-up visits as told by your health care provider. This is important.  Do any exercises as told by your health care provider, physical therapist, or occupational therapist. SEEK MEDICAL CARE IF:   You have new symptoms.  Your pain is not controlled with medicines.  Your symptoms get worse.   This information is not intended to replace advice given to you by your health care provider. Make sure you discuss any questions you have with your health care provider.   Document Released: 08/25/2000 Document Revised: 05/19/2015 Document Reviewed: 01/13/2015 Elsevier Interactive Patient Education 2016 [employer] Muscle Cramps and Spasms Muscle cramps and spasms occur when a muscle or  muscles tighten and you have no control over this tightening (involuntary muscle contraction). They are a common problem and can develop in any muscle. The most common place is in the calf muscles of the leg. Both muscle cramps and muscle spasms are involuntary muscle contractions, but they also have differences:   Muscle cramps are sporadic and painful. They may last a few seconds to a quarter of an hour. Muscle cramps are  often more forceful and last longer than muscle spasms.  Muscle spasms may or may not be painful. They may also last just a few seconds or much longer. CAUSES  It is uncommon for cramps or spasms to be due to a serious underlying problem. In many cases, the cause of cramps or spasms is unknown. Some common causes are:   Overexertion.   Overuse from repetitive motions (doing the same thing over and over).   Remaining in a certain position for a long period of time.   Improper preparation, form, or technique while performing a sport or activity.   Dehydration.   Injury.   Side effects of some medicines.   Abnormally low levels of the salts and ions in your blood (electrolytes), especially potassium and calcium. This could happen if you are taking water pills (diuretics) or you are pregnant.  Some underlying medical problems can make it more likely to develop cramps or spasms. These include, but are not limited to:   Diabetes.   Parkinson disease.   Hormone disorders, such as thyroid problems.   Alcohol abuse.   Diseases specific to muscles, joints, and bones.   Blood vessel disease where not enough blood is getting to the muscles.  HOME CARE INSTRUCTIONS   Stay well hydrated. Drink enough water and fluids to keep your urine clear or pale yellow.  It may be helpful to massage, stretch, and relax the affected muscle.  For tight or tense muscles, use a warm towel, heating pad, or hot shower water directed to the affected area.  If you are sore or have pain after a cramp or spasm, applying ice to the affected area may relieve discomfort.  Put ice in a plastic bag.  Place a towel between your skin and the bag.  Leave the ice on for 15-20 minutes, 03-04 times a day.  Medicines used to treat a known cause of cramps or spasms may help reduce their frequency or severity. Only take over-the-counter or prescription medicines as directed by your caregiver. SEEK  MEDICAL CARE IF:  Your cramps or spasms get more severe, more frequent, or do not improve over time.  MAKE SURE YOU:   Understand these instructions.  Will watch your condition.  Will get help right away if you are not doing well or get worse.   This information is not intended to replace advice given to you by your health care provider. Make sure you discuss any questions you have with your health care provider.   Document Released: 02/17/2002 Document Revised: 12/23/2012 Document Reviewed: 08/14/2012 Elsevier Interactive Patient Education [employer]

## 2016-04-13 ENCOUNTER — Encounter (HOSPITAL_BASED_OUTPATIENT_CLINIC_OR_DEPARTMENT_OTHER): Payer: Self-pay | Admitting: Emergency Medicine

## 2016-04-13 ENCOUNTER — Emergency Department (HOSPITAL_BASED_OUTPATIENT_CLINIC_OR_DEPARTMENT_OTHER)
Admission: EM | Admit: 2016-04-13 | Discharge: 2016-04-13 | Disposition: A | Discharge status: Home / Self Care | Payer: Self-pay | Attending: Physician Assistant | Admitting: Physician Assistant

## 2016-04-13 DIAGNOSIS — F17210 Nicotine dependence, cigarettes, uncomplicated: Secondary | ICD-10-CM | POA: Insufficient documentation

## 2016-04-13 DIAGNOSIS — R0789 Other chest pain: Secondary | ICD-10-CM | POA: Insufficient documentation

## 2016-04-13 DIAGNOSIS — L639 Alopecia areata, unspecified: Secondary | ICD-10-CM | POA: Insufficient documentation

## 2016-04-13 DIAGNOSIS — R52 Pain, unspecified: Secondary | ICD-10-CM

## 2016-04-13 DIAGNOSIS — R42 Dizziness and giddiness: Secondary | ICD-10-CM | POA: Insufficient documentation

## 2016-04-13 DIAGNOSIS — M549 Dorsalgia, unspecified: Secondary | ICD-10-CM | POA: Insufficient documentation

## 2016-04-13 NOTE — ED Triage Notes (Addendum)
Patient reports generalized joint pain x 4 weeks.  States "I hurt all over". Patient reports pain is worst in chest and mid back.  Reports shortness of breath.  Reports hair loss to chin and as well as spot to top of head.  Denies fevers, N/V/D.  Patient states no current PCP so he came to ER.

## 2016-04-13 NOTE — Discharge Instructions (Signed)
Please use the phone number to find a primary care physician for testing about lupus if you're still concerned. We gave you some information on alopecia areata.

## 2016-04-13 NOTE — ED Provider Notes (Signed)
[facility] Provider Note   CSN: [id number] Arrival date & time: 04/13/16  0909  First Provider Contact:  None       History   Chief Complaint Chief Complaint  Patient presents with  . Generalized Body Aches  . Alopecia    HPI Angel Mccoy is a 30 y.o. male.  HPI  Patient is a 30 year old male presenting with concern that he might have lupus. Patient has alopecia areata and has had some fatigue and googled it and worried that he might have lupus. His grandmother apparently had some lupus on her side with family.  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 This is going on for several days.  Past Medical History:  Diagnosis Date  . Back pain     Patient Active Problem List   Diagnosis Date Noted  . Vomiting alone 07/03/2014    Past Surgical History:  Procedure Laterality Date  . TONSILLECTOMY         Home Medications    Prior to Admission medications   Medication Sig Start Date End Date Taking? Authorizing Provider  clindamycin (CLEOCIN) 150 MG capsule Take 3 capsules (450 mg total) by mouth 3 (three) times daily. 12/21/13   Elwin Mocha, MD  clindamycin (CLEOCIN) 150 MG capsule Take 1 capsule (150 mg total) by mouth every 6 (six) hours. 12/08/14   Junius Finner, PA-C  clindamycin (CLEOCIN) 300 MG capsule Take 1 capsule (300 mg total) by mouth 4 (four) times daily. X 7 days 03/01/14   Geoffery Lyons, MD  HYDROcodone-acetaminophen (NORCO) 5-325 MG per tablet Take 1-2 tablets by mouth every 4 (four) hours as needed. 03/01/14   Geoffery Lyons, MD  HYDROcodone-acetaminophen (NORCO/VICODIN) 5-325 MG per tablet Take 1 tablet by mouth every 6 (six) hours as needed for moderate pain. 12/21/13   Elwin Mocha, MD  ondansetron (ZOFRAN ODT) 4 MG disintegrating tablet  ODT q4 hours prn nausea/vomit 07/03/14   Purvis Sheffield, MD  oxyCODONE-acetaminophen (PERCOCET/ROXICET) 5-325 MG per tablet Take 1 tablet by mouth every  6 (six) hours as needed for severe pain. 12/08/14   Junius Finner, PA-C    Family History History reviewed. No pertinent family history.  Social History Social History  Substance Use Topics  . Smoking status: Current Every Day Smoker    Packs/day: 1.00    Types: Cigarettes  . Smokeless tobacco: Former Neurosurgeon  . Alcohol use No     Allergies   Ceclor [cefaclor]; Sequinavir [saquinavir]; Toradol [ketorolac tromethamine]; and Ultram [tramadol]   Review of Systems Review of Systems  Constitutional: Negative for activity change.  Respiratory: Negative for shortness of breath.   Cardiovascular: Positive for chest pain.  Gastrointestinal: Negative for abdominal pain.  Musculoskeletal: Positive for arthralgias and back pain.  All other systems reviewed and are negative.    Physical Exam Updated Vital Signs BP 123/74   Pulse 64   Temp 98.3 F (36.8 C) (Oral)   Resp 18   Ht  (1.88 m)   Wt 244 lb (110.7 kg)   SpO2 100%   BMI 31.33 kg/m   Physical Exam  Constitutional: He is oriented to person, place, and time. He appears well-nourished.  HENT:  Head: Normocephalic.  Alopecia areata on chin and head. No telomeres  Eyes: Conjunctivae are normal.  Cardiovascular: Normal rate.   Pulmonary/Chest: Effort normal.  Neurological: He is oriented to person, place, and time.  Skin: Skin is warm and  dry. He is not diaphoretic.  Psychiatric: He has a normal mood and affect. His behavior is normal.     ED Treatments / Results  Labs (all labs ordered are listed, but only abnormal results are displayed) Labs Reviewed - No data to display  EKG  EKG Interpretation  Date/Time:  Thursday April 13 2016 09:30:28 EDT Ventricular Rate:  57 PR Interval:    QRS Duration: 118 QT Interval:  439 QTC Calculation: 428 R Axis:   82 Text Interpretation:  Sinus rhythm Nonspecific intraventricular conduction delay ST elev, probable normal early repol pattern No significant change since  last tracing Confirmed by Kandis Mannan ([id number]) on 04/13/2016 10:08:26 AM       Radiology No results found.  Procedures Procedures (including critical care time)  Medications Ordered in ED Medications - No data to display   Initial Impression / Assessment and Plan / ED Course  I have reviewed the triage vital signs and the nursing notes.  Pertinent labs & imaging results that were available during my care of the patient were reviewed by me and considered in my medical decision making (see chart for details).  Clinical Course    Patient is a 30 year old male presenting with alopecia areata and fatigue who googled symptoms and is concerned about lupus. We will have him follow-up with primary care physician for testing. We reassured him. Normal vital signs physical exam. 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 Do not suspect any other pathology at this time.    Final Clinical Impressions(s) / ED Diagnoses   Final diagnoses:  None    New Prescriptions New Prescriptions   No medications on file     Lynann Demetrius Randall An, MD 04/13/16 1014

## 2017-12-11 ENCOUNTER — Emergency Department (HOSPITAL_BASED_OUTPATIENT_CLINIC_OR_DEPARTMENT_OTHER)
Admission: EM | Admit: 2017-12-11 | Discharge: 2017-12-11 | Disposition: A | Discharge status: Home / Self Care | Payer: Self-pay | Attending: Emergency Medicine | Admitting: Emergency Medicine

## 2017-12-11 ENCOUNTER — Other Ambulatory Visit: Payer: Self-pay

## 2017-12-11 ENCOUNTER — Encounter (HOSPITAL_BASED_OUTPATIENT_CLINIC_OR_DEPARTMENT_OTHER): Payer: Self-pay

## 2017-12-11 DIAGNOSIS — Z79899 Other long term (current) drug therapy: Secondary | ICD-10-CM | POA: Insufficient documentation

## 2017-12-11 DIAGNOSIS — J40 Bronchitis, not specified as acute or chronic: Secondary | ICD-10-CM | POA: Insufficient documentation

## 2017-12-11 DIAGNOSIS — R112 Nausea with vomiting, unspecified: Secondary | ICD-10-CM

## 2017-12-11 DIAGNOSIS — R197 Diarrhea, unspecified: Secondary | ICD-10-CM

## 2017-12-11 DIAGNOSIS — Z720 Tobacco use: Secondary | ICD-10-CM

## 2017-12-11 DIAGNOSIS — F17210 Nicotine dependence, cigarettes, uncomplicated: Secondary | ICD-10-CM | POA: Insufficient documentation

## 2017-12-11 DIAGNOSIS — J069 Acute upper respiratory infection, unspecified: Secondary | ICD-10-CM | POA: Insufficient documentation

## 2017-12-11 MED ORDER — ONDANSETRON 4 MG PO TBDP: 4.0000 mg | ORAL_TABLET | Freq: Three times a day (TID) | ORAL | 0 refills | Status: AC | PRN

## 2017-12-11 MED ORDER — ONDANSETRON 4 MG PO TBDP
4.0000 mg | ORAL_TABLET | Freq: Once | ORAL | Status: AC
  Administered 2017-12-11: 4 mg via ORAL
  Filled 2017-12-11: qty 1

## 2017-12-11 MED ORDER — AZITHROMYCIN 250 MG PO TABS: ORAL_TABLET | ORAL | 0 refills | Status: AC

## 2017-12-11 NOTE — ED Provider Notes (Signed)
[facility] EMERGENCY DEPARTMENT Provider Note   CSN: [id number] Arrival date & time: 12/11/17  1514     History   Chief Complaint Chief Complaint  Patient presents with  . Cough    HPI Angel Mccoy is a 32 y.o. male with a PMHx of chronic back pain, who presents to the ED with complaints of cough with yellowish greenish sputum production x 2-3 weeks, and flulike symptoms that began about 3 days ago.  His symptoms include chills, fatigue, body aches, rhinorrhea, sore throat, nausea, one episode of nonbloody nonbilious emesis on Saturday but none since then, and 2-3 episodes daily of nonbloody diarrhea since yesterday.  He tried Benadryl and sore throat spray with no relief of his symptoms, no known aggravating factors.  He admits to being a cigarette smoker.  No known sick contacts.  He did not receive the flu shot this season.  He denies any fevers, drooling, trismus, ear pain or drainage, chest pain, shortness of breath, wheezing, abdominal pain, hematemesis, constipation, melena, hematochezia, dysuria, hematuria, numbness, tingling, focal weakness, rashes, or any other complaints at this time.  The history is provided by the patient and medical records. No language interpreter was used.  Cough  This is a new problem. The current episode started more than 1 week ago. The problem occurs constantly. The problem has not changed since onset.The cough is productive of sputum. There has been no fever. Associated symptoms include chills, rhinorrhea, sore throat and myalgias. Pertinent negatives include no chest pain, no ear pain, no shortness of breath and no wheezing. Treatments tried: benadryl and throat spray. The treatment provided no relief. He is a smoker.    Past Medical History:  Diagnosis Date  . Back pain     Patient Active Problem List   Diagnosis Date Noted  . Vomiting alone 07/03/2014    Past Surgical History:  Procedure Laterality Date  . TONSILLECTOMY           Home Medications    Prior to Admission medications   Medication Sig Start Date End Date Taking? Authorizing Provider  clindamycin (CLEOCIN) 150 MG capsule Take 3 capsules (450 mg total) by mouth 3 (three) times daily. 12/21/13   Elwin Mocha, MD  clindamycin (CLEOCIN) 150 MG capsule Take 1 capsule (150 mg total) by mouth every 6 (six) hours. 12/08/14   Lurene Shadow, PA-C  clindamycin (CLEOCIN) 300 MG capsule Take 1 capsule (300 mg total) by mouth 4 (four) times daily. X 7 days 03/01/14   Geoffery Lyons, MD  HYDROcodone-acetaminophen (NORCO) 5-325 MG per tablet Take 1-2 tablets by mouth every 4 (four) hours as needed. 03/01/14   Geoffery Lyons, MD  HYDROcodone-acetaminophen (NORCO/VICODIN) 5-325 MG per tablet Take 1 tablet by mouth every 6 (six) hours as needed for moderate pain. 12/21/13   Elwin Mocha, MD  ondansetron (ZOFRAN ODT) 4 MG disintegrating tablet 4mg  ODT q4 hours prn nausea/vomit 07/03/14   Purvis Sheffield, MD  oxyCODONE-acetaminophen (PERCOCET/ROXICET) 5-325 MG per tablet Take 1 tablet by mouth every 6 (six) hours as needed for severe pain. 12/08/14   Lurene Shadow, PA-C    Family History No family history on file.  Social History Social History   Tobacco Use  . Smoking status: Current Every Day Smoker    Packs/day: 1.00    Types: Cigarettes  . Smokeless tobacco: Former Engineer, water Use Topics  . Alcohol use: No  . Drug use: No     Allergies   Ceclor [cefaclor];  Sequinavir [saquinavir]; Toradol [ketorolac tromethamine]; and Ultram [tramadol]   Review of Systems Review of Systems  Constitutional: Positive for chills and fatigue. Negative for fever.  HENT: Positive for rhinorrhea and sore throat. Negative for drooling, ear discharge, ear pain and trouble swallowing.   Respiratory: Positive for cough. Negative for shortness of breath and wheezing.   Cardiovascular: Negative for chest pain.  Gastrointestinal: Positive for diarrhea, nausea and vomiting.  Negative for abdominal pain, blood in stool and constipation.  Genitourinary: Negative for dysuria and hematuria.  Musculoskeletal: Positive for myalgias. Negative for arthralgias.  Skin: Negative for color change and rash.  Allergic/Immunologic: Negative for immunocompromised state.  Neurological: Negative for weakness and numbness.  Psychiatric/Behavioral: Negative for confusion.   All other systems reviewed and are negative for acute change except as noted in the HPI.    Physical Exam Updated Vital Signs BP 137/83 (BP Location: Left Arm)   Pulse 64   Temp 98.2 F (36.8 C) (Oral)   Resp 20   Ht 6\' 2"  (1.88 m)   Wt 131.9 kg (290 lb 12.6 oz)   SpO2 100%   BMI 37.33 kg/m   Physical Exam  Constitutional: He is oriented to person, place, and time. Vital signs are normal. He appears well-developed and well-nourished.  Non-toxic appearance. No distress.  Afebrile, nontoxic, NAD  HENT:  Head: Normocephalic and atraumatic.  Nose: Mucosal edema and rhinorrhea present.  Mouth/Throat: Uvula is midline, oropharynx is clear and moist and mucous membranes are normal. No trismus in the jaw. No uvula swelling. Tonsils are 0 on the right. Tonsils are 0 on the left. No tonsillar exudate.  Nose mildly congested with mild rhinorrhea. Oropharynx clear and moist, without uvular swelling or deviation, no trismus or drooling, tonsils surgically absent, no oropharyngeal erythema, no exudates.    Eyes: Conjunctivae and EOM are normal. Right eye exhibits no discharge. Left eye exhibits no discharge.  Neck: Normal range of motion. Neck supple.  Cardiovascular: Normal rate, regular rhythm, normal heart sounds and intact distal pulses. Exam reveals no gallop and no friction rub.  No murmur heard. Pulmonary/Chest: Effort normal and breath sounds normal. No respiratory distress. He has no decreased breath sounds. He has no wheezes. He has no rhonchi. He has no rales.  CTAB in all lung fields, no w/r/r, no  hypoxia or increased WOB, speaking in full sentences, SpO2 100% on RA   Abdominal: Soft. Normal appearance and bowel sounds are normal. He exhibits no distension. There is no tenderness. There is no rigidity, no rebound, no guarding, no CVA tenderness, no tenderness at McBurney's point and negative Murphy's sign.  Soft, NTND, +BS throughout, no r/g/r, neg murphy's, neg mcburney's, no CVA TTP   Musculoskeletal: Normal range of motion.  Lymphadenopathy:    He has cervical adenopathy.  Shotty cervical LAD bilaterally which is nonTTP  Neurological: He is alert and oriented to person, place, and time. He has normal strength. No sensory deficit.  Skin: Skin is warm, dry and intact. No rash noted.  Psychiatric: He has a normal mood and affect.  Nursing note and vitals reviewed.    ED Treatments / Results  Labs (all labs ordered are listed, but only abnormal results are displayed) Labs Reviewed - No data to display  EKG None  Radiology No results found.  Procedures Procedures (including critical care time)  Medications Ordered in ED Medications  ondansetron (ZOFRAN-ODT) disintegrating tablet 4 mg (4 mg Oral Given 12/11/17 1541)     Initial Impression /  Assessment and Plan / ED Course  I have reviewed the triage vital signs and the nursing notes.  Pertinent labs & imaging results that were available during my care of the patient were reviewed by me and considered in my medical decision making (see chart for details).     32 y.o. male here with cough x2-3wks and then flu like symptoms that began 3 days ago. On exam, no abdominal tenderness, throat clear, nose congested with mild rhinorrhea, lungs clear without rhonchi/rales/wheezing, afebrile and nontoxic appearing. Could be flu but he's outside of window where tamiflu would be helpful; given that cough has been going on for nearly 3wks with symptoms worsening recently, will empirically cover for bacterial etiologies; doubt need for CXR  since this would not change our management. Will give zofran and PO challenge, as long as he does well then will send home with zofran and abx for bronchitis, advised BRAT diet and other OTC remedies for symptomatic relief and f/up with PCP in 1wk for recheck. Smoking cessation was strongly encouraged. Will recheck after zofran/PO challenge.   4:07 PM Pt feeling better and tolerating PO well; doubt need for further emergent work up at this time. Will d/c home with previously outlined plan. I explained the diagnosis and have given explicit precautions to return to the ER including for any other new or worsening symptoms. The patient understands and accepts the medical plan as it's been dictated and I have answered their questions. Discharge instructions concerning home care and prescriptions have been given. The patient is STABLE and is discharged to home in good condition.    Final Clinical Impressions(s) / ED Diagnoses   Final diagnoses:  Bronchitis  Acute upper respiratory infection  Nausea vomiting and diarrhea  Tobacco user    ED Discharge Orders        Ordered    ondansetron (ZOFRAN ODT) 4 MG disintegrating tablet  Every 8 hours PRN     12/11/17 1606    azithromycin (ZITHROMAX Z-PAK) 250 MG tablet     12/11/17 [address]1606       Tjay Velazquez, BuckeyeMercedes, [place] JerseyPA-C 12/11/17 1607    Terrilee FilesButler, Michael C, MD 12/12/17 1129

## 2017-12-11 NOTE — ED Triage Notes (Signed)
C/o flu like sx-started with a cough 2 weeks ago-NAD-steady gait

## 2017-12-11 NOTE — ED Notes (Signed)
Gave pt some water and ginger ale

## 2017-12-11 NOTE — ED Notes (Signed)
Pt verbalizes understanding of d/c instructions and denies any further needs at this time. 

## 2017-12-11 NOTE — Discharge Instructions (Addendum)
Your symptoms are likely due to a viral illness, however given that your cough has persisted this long, we are treating you for possible bacterial infection, but keep in mind that if it's just a virus then the antibiotic might not do anything for your illness and it will just get better with time. Please take all of your antibiotics until finished!   You may develop abdominal discomfort or diarrhea from the antibiotic.  You may help offset this with probiotics which you can buy or get in yogurt. Do not eat  or take the probiotics until 2 hours after your antibiotic.  Follow the BRAT diet outlined below to help with diarrhea as well. You may use over the counter pepto bismol or other diarrhea remedies to help as well.   Use zofran as directed as needed for nausea. Continue to stay well-hydrated. Gargle warm salt water and spit it out and use chloraseptic spray as needed for sore throat. Continue to alternate between Tylenol and Ibuprofen for pain or fever. Use Mucinex for cough suppression/expectoration of mucus. Use netipot and flonase to help with nasal congestion. May consider over-the-counter Benadryl or other antihistamine to decrease secretions and for help with your symptoms. STOP SMOKING! Follow up with your primary care doctor in 5-7 days for recheck of ongoing symptoms. Return to emergency department for emergent changing or worsening of symptoms.
